# Patient Record
Sex: Male | Born: 1960 | Race: Black or African American | Hispanic: No | Marital: Married | Smoking: Never smoker
Health system: Southern US, Community
[De-identification: ages and names within clinical notes are randomized; demographics above are authoritative.]

## PROBLEM LIST (undated history)

## (undated) DIAGNOSIS — E7800 Pure hypercholesterolemia, unspecified: Secondary | ICD-10-CM

## (undated) HISTORY — PX: HEMORRHOID SURGERY: SHX153

## (undated) HISTORY — DX: Pure hypercholesterolemia, unspecified: E78.00

---

## 2005-06-07 HISTORY — PX: PLANTAR FASCIA SURGERY: SHX746

## 2007-02-28 ENCOUNTER — Ambulatory Visit: Payer: Self-pay | Admitting: Gastroenterology

## 2011-03-30 ENCOUNTER — Ambulatory Visit: Payer: Self-pay | Admitting: Family Medicine

## 2014-08-28 ENCOUNTER — Ambulatory Visit: Payer: Commercial Managed Care - PPO

## 2014-08-28 ENCOUNTER — Ambulatory Visit (INDEPENDENT_AMBULATORY_CARE_PROVIDER_SITE_OTHER): Payer: Commercial Managed Care - PPO

## 2014-08-28 ENCOUNTER — Encounter: Payer: Self-pay | Admitting: Podiatry

## 2014-08-28 ENCOUNTER — Ambulatory Visit (INDEPENDENT_AMBULATORY_CARE_PROVIDER_SITE_OTHER): Payer: Commercial Managed Care - PPO | Admitting: Podiatry

## 2014-08-28 VITALS — BP 145/88 | HR 70 | Resp 16 | Ht 73.0 in | Wt 208.0 lb

## 2014-08-28 DIAGNOSIS — M722 Plantar fascial fibromatosis: Secondary | ICD-10-CM

## 2014-08-28 MED ORDER — MELOXICAM 15 MG PO TABS: 15.0000 mg | ORAL_TABLET | Freq: Every day | ORAL | Status: DC

## 2014-08-28 MED ORDER — METHYLPREDNISOLONE (PAK) 4 MG PO TABS: ORAL_TABLET | ORAL | Status: DC

## 2014-08-28 NOTE — Progress Notes (Signed)
   Subjective:    Patient ID: Daniel Tapia, male    DOB: [birth year], 54 y.o.   MRN: [id number]  HPI Left plantar heel pain. Soreness and numbness . It hurts after resting and the first step down. It has been going on for 3-4 months now.    Review of Systems  All other systems reviewed and are negative.      Objective:   Physical Exam: I have reviewed his past medical history medications allergies surgery social history and review of systems. Pulses are strongly palpable bilateral. Neurologic sensorium is intact percent one C monofilament. Deep tendon reflexes are intact bilateral and muscle strength is 5 over 5 dorsiflexion plantar flexors and inverters and everters all intrinsic musculature is intact. Orthopedic evaluation of a straight spelled palpation medially continue tubercle of the left heel. Radiograph confirms soft tissue increase in density at the plantar fascial calcaneal insertion site.        Assessment & Plan:  Assessment: Plantar fasciitis left.  Plan: Discussed etiology pathology conservative versus surgical therapies. I injected his left heel today placed him in the plantar fascial brace and a night splint. We discussed appropriate shoe gear stretching exercises ice therapy and sugar patients. Started him on a Medrol Dosepak followed by meloxicam. I will follow-up with him in 1 month

## 2014-08-28 NOTE — Patient Instructions (Signed)

## 2014-09-25 ENCOUNTER — Ambulatory Visit (INDEPENDENT_AMBULATORY_CARE_PROVIDER_SITE_OTHER): Payer: 59 | Admitting: Podiatry

## 2014-09-25 VITALS — BP 114/71 | HR 61 | Resp 16

## 2014-09-25 DIAGNOSIS — M722 Plantar fascial fibromatosis: Secondary | ICD-10-CM

## 2014-09-25 NOTE — Progress Notes (Signed)
He presents today for follow-up of his plantar fasciitis. He states that he is approximately 80-85% improved. He continues his anti-inflammatories and all other conservative therapies.  Objective: Vital signs are stable he is alert and oriented 3. Pulses are palpable bilateral. Neurologic sensorium is intact percent joint C monofilament. He has pain on palpation medial calcaneal tubercle bilateral heels that much decreased from last visit.  Assessment: Plantar fasciitis bilateral.  Plan: Reinjected bilateral heels today continue all conservative therapies. Follow up with me in 1 month if necessary at which time orthotics will be scanned.

## 2014-10-23 ENCOUNTER — Ambulatory Visit: Payer: Commercial Managed Care - PPO | Admitting: Podiatry

## 2015-03-14 ENCOUNTER — Other Ambulatory Visit: Payer: Self-pay | Admitting: Podiatry

## 2015-06-13 ENCOUNTER — Encounter: Payer: Self-pay | Admitting: Family Medicine

## 2015-06-13 ENCOUNTER — Ambulatory Visit (INDEPENDENT_AMBULATORY_CARE_PROVIDER_SITE_OTHER): Payer: 59 | Admitting: Family Medicine

## 2015-06-13 VITALS — BP 118/64 | HR 92 | Temp 97.8°F | Resp 18 | Ht 73.0 in | Wt 209.0 lb

## 2015-06-13 DIAGNOSIS — M79672 Pain in left foot: Secondary | ICD-10-CM | POA: Diagnosis not present

## 2015-06-13 DIAGNOSIS — Z0000 Encounter for general adult medical examination without abnormal findings: Secondary | ICD-10-CM

## 2015-06-13 DIAGNOSIS — M79645 Pain in left finger(s): Secondary | ICD-10-CM

## 2015-06-13 DIAGNOSIS — Z23 Encounter for immunization: Secondary | ICD-10-CM | POA: Diagnosis not present

## 2015-06-13 DIAGNOSIS — Z125 Encounter for screening for malignant neoplasm of prostate: Secondary | ICD-10-CM | POA: Diagnosis not present

## 2015-06-13 MED ORDER — AZITHROMYCIN 250 MG PO TABS: ORAL_TABLET | ORAL | Status: DC

## 2015-06-13 MED ORDER — BENZONATATE 100 MG PO CAPS: 100.0000 mg | ORAL_CAPSULE | Freq: Two times a day (BID) | ORAL | Status: DC | PRN

## 2015-06-13 NOTE — Progress Notes (Signed)
Name: Daniel Tapia   MRN: [id number]    DOB: [birth year]   Date:06/13/2015       Progress Note  Subjective  Chief Complaint  Chief Complaint  Patient presents with  . Annual Exam    HPI    56 year old male presenting for annual H&P.   Plantar fasciitis  Patient has a history of bilateral heel pain greater on the left. He has seen a podiatrist and had at least 2 injections in the left heel and is using an insert with no significant improvement. He's currently on meloxicam which is not bring any improvement either.  Left index finger injury  Patient states that at his employment he often has to spend an object with his fingers rapidly. He did a few weeks ago and had sudden onset of some pain and is comfort and since that time as been able to straighten the finger completely and is swollen and tender since. He did it was not evaluated and did not have it x-rayed. The pain is persisting and meloxicam is not bringing any improvement.   No past medical history on file.  Social History  Substance Use Topics  . Smoking status: Never Smoker   . Smokeless tobacco: Not on file  . Alcohol Use: Not on file     Current outpatient prescriptions:  .  meloxicam (MOBIC) 15 MG tablet, TAKE 1 TABLET (15 MG TOTAL) BY MOUTH DAILY., Disp: 30 tablet, Rfl: 7 .  methylPREDNIsolone (MEDROL DOSPACK) 4 MG tablet, 6 day tapering dose-follow package instructions (Patient not taking: Reported on 06/13/2015), Disp: 21 tablet, Rfl: 0  Not on File  Review of Systems  Constitutional: Negative for fever, chills and weight loss.  HENT: Negative for congestion, hearing loss, sore throat and tinnitus.   Eyes: Negative for blurred vision, double vision and redness.  Respiratory: Negative for cough, hemoptysis and shortness of breath.   Cardiovascular: Negative for chest pain, palpitations, orthopnea, claudication and leg swelling.  Gastrointestinal: Negative for heartburn, nausea, vomiting, diarrhea,  constipation and blood in stool.  Genitourinary: Negative for dysuria, urgency, frequency and hematuria.  Musculoskeletal: Positive for joint pain (left index finger. There is also bilateral heel pain). Negative for myalgias, back pain, falls and neck pain.  Skin: Negative for itching.  Neurological: Negative for dizziness, tingling, tremors, focal weakness, seizures, loss of consciousness, weakness and headaches.  Endo/Heme/Allergies: Does not bruise/bleed easily.  Psychiatric/Behavioral: Negative for depression and substance abuse. The patient is not nervous/anxious and does not have insomnia.      Objective  Filed Vitals:   06/13/15 0906  BP: 118/64  Pulse: 92  Temp: 97.8 F (36.6 C)  Resp: 18  Height: 6\' 1"  (1.854 m)  Weight: 209 lb (94.802 kg)  SpO2: 97%     Physical Exam  Constitutional: He is oriented to person, place, and time and well-developed, well-nourished, and in no distress.  HENT:  Head: Normocephalic.  Eyes: EOM are normal. Pupils are equal, round, and reactive to light.  Neck: Normal range of motion. Neck supple. No thyromegaly present.  Cardiovascular: Normal rate, regular rhythm and normal heart sounds.   No murmur heard. Pulmonary/Chest: Effort normal and breath sounds normal. No respiratory distress. He has no wheezes.  Abdominal: Soft. Bowel sounds are normal.  Genitourinary: Rectum normal, prostate normal and penis normal. Guaiac negative stool. No discharge found.  Musculoskeletal: He exhibits no edema.  The left index finger has used hormone swelling at the PIP joint. There is also some tenderness to  palpation on the palmar aspect of the digit. There is 4 range of motion but some discomfort with full flexion. There is no point tenderness. There is also discomfort palpation at the base of the left calcaneus reproducing pain.  Lymphadenopathy:    He has no cervical adenopathy.  Neurological: He is alert and oriented to person, place, and time. No  cranial nerve deficit. Gait normal. Coordination normal.  Skin: Skin is warm and dry. No rash noted.  Psychiatric: Affect and judgment normal.      Assessment & Plan  1. Annual physical exam  - CBC - Comprehensive Metabolic Panel (CMET) - Lipid panel - TSH  2. Prostate cancer screening  - PSA  3. Left foot pain Secondary to plantar fasciitis - Ambulatory referral to Orthopedics  4. Finger pain, left Rule out avulsion fracture - Ambulatory referral to Orthopedics  5. Need for influenza vaccination Given - Flu Vaccine QUAD 36+ mos PF IM (Fluarix & Fluzone Quad PF)

## 2015-07-01 LAB — COMPREHENSIVE METABOLIC PANEL
A/G RATIO: 1.8 (ref 1.1–2.5)
ALT: 79 IU/L — AB (ref 0–44)
AST: 49 IU/L — AB (ref 0–40)
Albumin: 4.6 g/dL (ref 3.5–5.5)
Alkaline Phosphatase: 95 IU/L (ref 39–117)
BUN/Creatinine Ratio: 10 (ref 9–20)
BUN: 12 mg/dL (ref 6–24)
Bilirubin Total: 0.7 mg/dL (ref 0.0–1.2)
CALCIUM: 9.7 mg/dL (ref 8.7–10.2)
CO2: 26 mmol/L (ref 18–29)
CREATININE: 1.16 mg/dL (ref 0.76–1.27)
Chloride: 100 mmol/L (ref 96–106)
GFR calc non Af Amer: 71 mL/min/{1.73_m2} (ref >59)
GFR, EST AFRICAN AMERICAN: 83 mL/min/{1.73_m2} (ref >59)
Globulin, Total: 2.5 g/dL (ref 1.5–4.5)
Glucose: 109 mg/dL — ABNORMAL HIGH (ref 65–99)
Potassium: 4.9 mmol/L (ref 3.5–5.2)
Sodium: 138 mmol/L (ref 134–144)
TOTAL PROTEIN: 7.1 g/dL (ref 6.0–8.5)

## 2015-07-01 LAB — LIPID PANEL
Chol/HDL Ratio: 4.7 ratio units (ref 0.0–5.0)
Cholesterol, Total: 222 mg/dL — ABNORMAL HIGH (ref 100–199)
HDL: 47 mg/dL (ref >39)
LDL CALC: 159 mg/dL — AB (ref 0–99)
TRIGLYCERIDES: 81 mg/dL (ref 0–149)
VLDL Cholesterol Cal: 16 mg/dL (ref 5–40)

## 2015-07-01 LAB — CBC
Hematocrit: 43.4 % (ref 37.5–51.0)
Hemoglobin: 14.5 g/dL (ref 12.6–17.7)
MCH: 27.4 pg (ref 26.6–33.0)
MCHC: 33.4 g/dL (ref 31.5–35.7)
MCV: 82 fL (ref 79–97)
PLATELETS: 272 10*3/uL (ref 150–379)
RBC: 5.3 x10E6/uL (ref 4.14–5.80)
RDW: 13.6 % (ref 12.3–15.4)
WBC: 3.9 10*3/uL (ref 3.4–10.8)

## 2015-07-01 LAB — PSA: Prostate Specific Ag, Serum: 0.4 ng/mL (ref 0.0–4.0)

## 2015-07-01 LAB — TSH: TSH: 1.83 u[IU]/mL (ref 0.450–4.500)

## 2017-06-22 ENCOUNTER — Ambulatory Visit (INDEPENDENT_AMBULATORY_CARE_PROVIDER_SITE_OTHER): Payer: 59 | Admitting: Family Medicine

## 2017-06-22 ENCOUNTER — Telehealth: Payer: Self-pay

## 2017-06-22 ENCOUNTER — Other Ambulatory Visit: Payer: Self-pay

## 2017-06-22 ENCOUNTER — Encounter: Payer: Self-pay | Admitting: Family Medicine

## 2017-06-22 ENCOUNTER — Telehealth: Payer: Self-pay | Admitting: Family Medicine

## 2017-06-22 VITALS — BP 116/76 | HR 84 | Temp 98.4°F | Resp 18 | Ht 73.0 in | Wt 209.2 lb

## 2017-06-22 DIAGNOSIS — Z13220 Encounter for screening for lipoid disorders: Secondary | ICD-10-CM

## 2017-06-22 DIAGNOSIS — Z1212 Encounter for screening for malignant neoplasm of rectum: Secondary | ICD-10-CM | POA: Diagnosis not present

## 2017-06-22 DIAGNOSIS — E7800 Pure hypercholesterolemia, unspecified: Secondary | ICD-10-CM | POA: Diagnosis not present

## 2017-06-22 DIAGNOSIS — Z125 Encounter for screening for malignant neoplasm of prostate: Secondary | ICD-10-CM | POA: Diagnosis not present

## 2017-06-22 DIAGNOSIS — Z0000 Encounter for general adult medical examination without abnormal findings: Secondary | ICD-10-CM | POA: Diagnosis not present

## 2017-06-22 DIAGNOSIS — Z1159 Encounter for screening for other viral diseases: Secondary | ICD-10-CM

## 2017-06-22 DIAGNOSIS — Z8639 Personal history of other endocrine, nutritional and metabolic disease: Secondary | ICD-10-CM | POA: Diagnosis not present

## 2017-06-22 DIAGNOSIS — Z1211 Encounter for screening for malignant neoplasm of colon: Secondary | ICD-10-CM | POA: Diagnosis not present

## 2017-06-22 DIAGNOSIS — E663 Overweight: Secondary | ICD-10-CM

## 2017-06-22 DIAGNOSIS — Z114 Encounter for screening for human immunodeficiency virus [HIV]: Secondary | ICD-10-CM

## 2017-06-22 NOTE — Telephone Encounter (Signed)
Patient notified to stop by at any time to have Tdap

## 2017-06-22 NOTE — Telephone Encounter (Signed)
Gastroenterology Pre-Procedure Review  Request Date: 07/06/17 Requesting Physician: Dr. Marius Ditch  PATIENT REVIEW QUESTIONS: The patient responded to the following health history questions as indicated:    1. Are you having any GI issues? no 2. Do you have a personal history of Polyps? no 3. Do you have a family history of Colon Cancer or Polyps? yes (Dad Polyps) 4. Diabetes Mellitus? no 5. Joint replacements in the past 12 months?no 6. Major health problems in the past 3 months?no 7. Any artificial heart valves, MVP, or defibrillator?no    MEDICATIONS & ALLERGIES:    Patient reports the following regarding taking any anticoagulation/antiplatelet therapy:   Plavix, Coumadin, Eliquis, Xarelto, Lovenox, Pradaxa, Brilinta, or Effient? no Aspirin? no  Patient confirms/reports the following medications:  No current outpatient medications on file.   No current facility-administered medications for this visit.     Patient confirms/reports the following allergies:  No Known Allergies  No orders of the defined types were placed in this encounter.   AUTHORIZATION INFORMATION Primary Insurance: 1D#: Group #:  Secondary Insurance: 1D#: Group #:  SCHEDULE INFORMATION: Date: 07/06/17 Time: Location:[facility]

## 2017-06-22 NOTE — Patient Instructions (Addendum)

## 2017-06-22 NOTE — Telephone Encounter (Signed)
Pt did not receive TdaP in office today, please call and have him return for nurse visit at his convenience. Thanks!

## 2017-06-22 NOTE — Progress Notes (Signed)
Name: Daniel Tapia   MRN: [id number]    DOB: 06/25/1960   Date:06/22/2017       Progress Note  Subjective  Chief Complaint  Chief Complaint  Patient presents with  . Annual Exam    HPI  Patient presents for annual CPE.  USPSTF grade A and B recommendations:  Diet: Is on the road a lot, eats out a lot because of this - avoids fast food, but still a lot restaurant foods for breakfast and lunch. Dinner he usually has a balanced meal that's a little healthier - occasionally eats at home. 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  IPSS Questionnaire (AUA-7): Over the past month.   1)  How often have you had a sensation of not emptying your bladder completely after you finish urinating?  1 - Less than 1 time in 5  2)  How often have you had to urinate again less than two hours after you finished urinating? 4 - More than half the time  3)  How often have you found you stopped and started again several times when you urinated?  0 - Not at all  4) How difficult have you found it to postpone urination?  0 - Not at all  5) How often have you had a weak urinary stream?  0 - Not at all  6) How often have you had to push or strain to begin urination?  0 - Not at all  7) How many times did you most typically get up to urinate from the time you went to bed until the time you got up in the morning?  1 - 1 time  Total score:  0-7 mildly symptomatic   8-19 moderately symptomatic   20-35 severely symptomatic  AUA score of 6 - we will check PSA again today.  Depression:  Depression screen Daniel Tapia 2/9 06/22/2017 06/13/2015  Decreased Interest 0 0  Down, Depressed, Hopeless 0 0  PHQ - 2 Score 0 0   Hypertension: BP Readings from Last 3 Encounters:  06/22/17 116/76  06/13/15 118/64  09/25/14 114/71   Obesity: Wt Readings from Last 3 Encounters:  06/22/17 209 lb 3.2 oz (94.9 kg)  06/13/15  209 lb (94.8 kg)  08/28/14 208 lb (94.3 kg)   BMI Readings from Last 3 Encounters:  06/22/17 27.60 kg/m  06/13/15 27.57 kg/m  08/28/14 27.44 kg/m    Lipids:  Lab Results  Component Value Date   CHOL 222 (H) 06/30/2015   Lab Results  Component Value Date   HDL 47 06/30/2015   Lab Results  Component Value Date   LDLCALC 159 (H) 06/30/2015   Lab Results  Component Value Date   TRIG 81 06/30/2015   Lab Results  Component Value Date   CHOLHDL 4.7 06/30/2015   No results found for: LDLDIRECT Glucose:  Glucose  Date Value Ref Range Status  06/30/2015 109 (H) 65 - 99 mg/dL Final    Alcohol: Drinks socially - 3-5 in a week; Beer or a couple of mixed drinks Tobacco use: Never user  Married STD testing and prevention (chl/gon/syphilis): Declines. HIV, hep C: We will check one-time screening today  Skin cancer: No concerning moles or lesions. Colorectal cancer: We will refer today; no family history.  No changes to stools - no blood/black and tarry stools Prostate cancer: AUA score of 6,  we will check PSA today. No results found for: PSA Lung cancer: Low Dose CT Chest recommended if Age 19-80 years, 30 pack-year currently smoking OR have quit w/in 15years. Patient does not qualify.   AAA: Does not qualify. The USPSTF recommends one-time screening with ultrasonography in men ages 23 to 31 years who have ever smoked  Aspirin: We will check cholesterol panel today and determine if needed. ECG: Denies chest pain, shortness of breath, or palpitations. We will hold on this today.  Advanced Care Planning: A voluntary discussion about advance care planning including the explanation and discussion of advance directives.  Discussed health care proxy and Living will, and the patient was able to identify a health care proxy as Wife Daniel Tapia).  Patient does not have a living will at present time. If patient does have living will, I have requested they bring this to the  clinic to be scanned in to their chart.  There are no active problems to display for this patient.   Past Surgical History:  Procedure Laterality Date  . HEMORRHOID SURGERY     1990's    Family History  Problem Relation Age of Onset  . Dementia Mother   . Diabetes Paternal Grandfather     Social History   Socioeconomic History  . Marital status: Married    Spouse name: Not on file  . Number of children: Not on file  . Years of education: Not on file  . Highest education level: Not on file  Social Needs  . Financial resource strain: Not on file  . Food insecurity - worry: Not on file  . Food insecurity - inability: Not on file  . Transportation needs - medical: Not on file  . Transportation needs - non-medical: Not on file  Occupational History  . Not on file  Tobacco Use  . Smoking status: Never Smoker  . Smokeless tobacco: Never Used  Substance and Sexual Activity  . Alcohol use: Yes    Alcohol/week: 0.0 oz  . Drug use: No  . Sexual activity: Yes  Other Topics Concern  . 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 Sometimes back home at night, sometimes he's gone for a week at a time. Married with 4 children; 1 grandchild.    No current outpatient medications on file.  No Known Allergies   ROS  Constitutional: Negative for fever or weight change.  Respiratory: Negative for cough and shortness of breath.   Cardiovascular: Negative for chest pain or palpitations.  Gastrointestinal: Negative for abdominal pain, no bowel changes.  Musculoskeletal: Negative for gait problem or joint swelling.  Skin: Negative for rash.  Neurological: Negative for dizziness or headache.  No other specific complaints in a complete review of systems (except as listed in HPI above).   Objective  Vitals:   06/22/17 0958  BP: 116/76  Pulse: 84  Resp: 18  Temp: 98.4 F (36.9 C)  TempSrc: Oral  SpO2: 99%   Weight: 209 lb 3.2 oz (94.9 kg)  Height: 6\' 1"  (1.854 m)    Body mass index is 27.6 kg/m.  Physical Exam Constitutional: Patient appears well-developed and well-nourished. No distress.  HENT: Head: Normocephalic and atraumatic. Ears: B TMs ok, no erythema or effusion; Nose: Nose normal. Mouth/Throat: Oropharynx is clear and moist. No oropharyngeal exudate.  Eyes: Conjunctivae and EOM are normal. Pupils are equal, round, and reactive to light. No scleral icterus.  Neck: Normal range of motion. Neck supple. No JVD present. No thyromegaly present.  Cardiovascular: Normal rate, regular rhythm and normal heart sounds.  No murmur heard. No BLE edema. Pulmonary/Chest: Effort normal and breath sounds normal. No respiratory distress. Abdominal: Soft. Bowel sounds are normal, no distension. There is no tenderness. no masses MALE GENITALIA:Deferred RECTAL:Deferred Musculoskeletal: Normal range of motion, no joint effusions. No gross deformities Neurological: he is alert and oriented to person, place, and time. No cranial nerve deficit. Coordination, balance, strength, speech and gait are normal.  Skin: Skin is warm and dry. No rash noted. No erythema.  Psychiatric: Patient has a normal mood and affect. behavior is normal. Judgment and thought content normal.  No results found for this or any previous visit (from the past 2160 hour(s)).  PHQ2/9: Depression screen [facility] 2/9 06/22/2017 06/13/2015  Decreased Interest 0 0  Down, Depressed, Hopeless 0 0  PHQ - 2 Score 0 0   Fall Risk: Fall Risk  06/22/2017 06/13/2015  Falls in the past year? No No   Assessment & Plan 1. Encounter for preventive health examination -Prostate cancer screening and PSA options (with potential risks and benefits of testing vs not testing) were discussed along with recent recs/guidelines. -USPSTF grade A and B recommendations reviewed with patient; age-appropriate recommendations, preventive care, screening tests, etc  discussed and encouraged; healthy living encouraged; see AVS for patient education given to patient -Discussed importance of 150 minutes of physical activity weekly, eat two servings of fish weekly, eat one serving of tree nuts ( cashews, pistachios, pecans, almonds.Marland Kitchen) every other day, eat 6 servings of fruit/vegetables daily and drink plenty of water and avoid sweet beverages.  - Health Maintenance: Declines flu shot  2. Overweight (BMI 25.0-29.9) - COMPLETE METABOLIC PANEL WITH GFR  3. Elevated LDL cholesterol level - Lipid panel  4. Screening cholesterol level - Lipid panel  5. Prostate cancer screening - PSA  6. History of elevated glucose - COMPLETE METABOLIC PANEL WITH GFR  7. Screening for colorectal cancer - Ambulatory referral to Gastroenterology  8. Need for hepatitis C screening test - Hepatitis C antibody  9. Encounter for screening for HIV - HIV antibody

## 2017-06-23 ENCOUNTER — Telehealth: Payer: Self-pay | Admitting: Family Medicine

## 2017-06-23 ENCOUNTER — Encounter: Payer: 59 | Admitting: Family Medicine

## 2017-06-23 ENCOUNTER — Other Ambulatory Visit: Payer: Self-pay | Admitting: Family Medicine

## 2017-06-23 DIAGNOSIS — R7301 Impaired fasting glucose: Secondary | ICD-10-CM

## 2017-06-23 LAB — HEPATITIS C ANTIBODY
HEP C AB: NONREACTIVE
SIGNAL TO CUT-OFF: 0.12 (ref <1.00)

## 2017-06-23 LAB — LIPID PANEL
CHOL/HDL RATIO: 4.4 (calc) (ref <5.0)
CHOLESTEROL: 230 mg/dL — AB (ref <200)
HDL: 52 mg/dL (ref >40)
LDL Cholesterol (Calc): 158 mg/dL (calc) — ABNORMAL HIGH
NON-HDL CHOLESTEROL (CALC): 178 mg/dL — AB (ref <130)
Triglycerides: 95 mg/dL (ref <150)

## 2017-06-23 LAB — COMPLETE METABOLIC PANEL WITH GFR
AG RATIO: 1.7 (calc) (ref 1.0–2.5)
ALBUMIN MSPROF: 4.5 g/dL (ref 3.6–5.1)
ALKALINE PHOSPHATASE (APISO): 67 U/L (ref 40–115)
ALT: 33 U/L (ref 9–46)
AST: 22 U/L (ref 10–35)
BUN: 17 mg/dL (ref 7–25)
CO2: 30 mmol/L (ref 20–32)
Calcium: 10 mg/dL (ref 8.6–10.3)
Chloride: 102 mmol/L (ref 98–110)
Creat: 1.12 mg/dL (ref 0.70–1.33)
GFR, EST NON AFRICAN AMERICAN: 74 mL/min/{1.73_m2} (ref >=60)
GFR, Est African American: 85 mL/min/{1.73_m2} (ref >=60)
GLOBULIN: 2.7 g/dL (ref 1.9–3.7)
Glucose, Bld: 108 mg/dL — ABNORMAL HIGH (ref 65–99)
POTASSIUM: 5.3 mmol/L (ref 3.5–5.3)
Sodium: 139 mmol/L (ref 135–146)
Total Bilirubin: 0.9 mg/dL (ref 0.2–1.2)
Total Protein: 7.2 g/dL (ref 6.1–8.1)

## 2017-06-23 LAB — HIV ANTIBODY (ROUTINE TESTING W REFLEX): HIV: NONREACTIVE

## 2017-06-23 LAB — PSA: PSA: 0.3 ng/mL (ref <=4.0)

## 2017-06-23 NOTE — Progress Notes (Signed)
Spoke to lab and they will add on Hemoglobin A1c

## 2017-06-23 NOTE — Progress Notes (Signed)
Please call lab and add-on A1C - I have placed the order.

## 2017-06-23 NOTE — Telephone Encounter (Signed)
Results not routed to [facility]  Results given  Notes recorded by Hubbard Hartshorn, FNP on 06/23/2017 at 8:07 AM EST PSA normal CMP- elevated fasting glucose - **I have added on A1C, please ensure this is communicated to the lab**; otherwise kidney and transaminases are within normal limits. Cholesterol Panel - elevated LDL, HDL is at goal. ASCVD 10 year risk for heart attack/stroke is 5.8% - no need to start Statin or Aspirin therapy at this time. Hep C and HIV negative - please update HM.

## 2017-07-04 ENCOUNTER — Other Ambulatory Visit: Payer: Self-pay

## 2017-07-04 MED ORDER — NA SULFATE-K SULFATE-MG SULF 17.5-3.13-1.6 GM/177ML PO SOLN: 1.0000 | Freq: Once | ORAL | 0 refills | Status: AC

## 2017-07-04 NOTE — Progress Notes (Signed)
Rx for Su-Prep Faxed to [facility].

## 2017-07-05 ENCOUNTER — Encounter: Payer: Self-pay | Admitting: *Deleted

## 2017-07-05 ENCOUNTER — Other Ambulatory Visit: Payer: Self-pay

## 2017-07-05 NOTE — Discharge Instructions (Signed)
General Anesthesia, Adult, Care After °These instructions provide you with information about caring for yourself after your procedure. Your health care provider may also give you more specific instructions. Your treatment has been planned according to current medical practices, but problems sometimes occur. Call your health care provider if you have any problems or questions after your procedure. °What can I expect after the procedure? °After the procedure, it is common to have: °· Vomiting. °· A sore throat. °· Mental slowness. ° °It is common to feel: °· Nauseous. °· Cold or shivery. °· Sleepy. °· Tired. °· Sore or achy, even in parts of your body where you did not have surgery. ° °Follow these instructions at home: °For at least 24 hours after the procedure: °· Do not: °? Participate in activities where you could fall or become injured. °? Drive. °? Use heavy machinery. °? Drink alcohol. °? Take sleeping pills or medicines that cause drowsiness. °? Make important decisions or sign legal documents. °? Take care of children on your own. °· Rest. °Eating and drinking °· If you vomit, drink water, juice, or soup when you can drink without vomiting. °· Drink enough fluid to keep your urine clear or pale yellow. °· Make sure you have little or no nausea before eating solid foods. °· Follow the diet recommended by your health care provider. °General instructions °· Have a responsible adult stay with you until you are awake and alert. °· Return to your normal activities as told by your health care provider. Ask your health care provider what activities are safe for you. °· Take over-the-counter and prescription medicines only as told by your health care provider. °· If you smoke, do not smoke without supervision. °· Keep all follow-up visits as told by your health care provider. This is important. °Contact a health care provider if: °· You continue to have nausea or vomiting at home, and medicines are not helpful. °· You  cannot drink fluids or start eating again. °· You cannot urinate after 8-12 hours. °· You develop a skin rash. °· You have fever. °· You have increasing redness at the site of your procedure. °Get help right away if: °· You have difficulty breathing. °· You have chest pain. °· You have unexpected bleeding. °· You feel that you are having a life-threatening or urgent problem. °This information is not intended to replace advice given to you by your health care provider. Make sure you discuss any questions you have with your health care provider. °Document Released: 08/30/2000 Document Revised: 10/27/2015 Document Reviewed: 05/08/2015 °Elsevier Interactive Patient Education © 2018 [employer] ° °

## 2017-07-06 ENCOUNTER — Encounter
Admission: RE | Disposition: A | Discharge status: Home / Self Care | Payer: Self-pay | Source: Ambulatory Visit | Attending: Gastroenterology

## 2017-07-06 ENCOUNTER — Ambulatory Visit: Payer: 59 | Admitting: Anesthesiology

## 2017-07-06 ENCOUNTER — Ambulatory Visit
Admission: RE | Admit: 2017-07-06 | Discharge: 2017-07-06 | Disposition: A | Discharge status: Home / Self Care | Payer: 59 | Source: Ambulatory Visit | Attending: Gastroenterology | Admitting: Gastroenterology

## 2017-07-06 DIAGNOSIS — Z1211 Encounter for screening for malignant neoplasm of colon: Secondary | ICD-10-CM | POA: Diagnosis not present

## 2017-07-06 DIAGNOSIS — D123 Benign neoplasm of transverse colon: Secondary | ICD-10-CM

## 2017-07-06 DIAGNOSIS — K635 Polyp of colon: Secondary | ICD-10-CM | POA: Diagnosis not present

## 2017-07-06 HISTORY — PX: POLYPECTOMY: SHX5525

## 2017-07-06 HISTORY — PX: COLONOSCOPY WITH PROPOFOL: SHX5780

## 2017-07-06 SURGERY — COLONOSCOPY WITH PROPOFOL
Anesthesia: General | Site: Rectum | Wound class: Contaminated

## 2017-07-06 MED ORDER — OXYCODONE HCL 5 MG PO TABS: 5.0000 mg | ORAL_TABLET | Freq: Once | ORAL | Status: DC | PRN

## 2017-07-06 MED ORDER — MEPERIDINE HCL 25 MG/ML IJ SOLN: 6.2500 mg | INTRAMUSCULAR | Status: DC | PRN

## 2017-07-06 MED ORDER — FENTANYL CITRATE (PF) 100 MCG/2ML IJ SOLN
25.0000 ug | INTRAMUSCULAR | Status: DC | PRN
Start: 2017-07-06 — End: 2017-07-06

## 2017-07-06 MED ORDER — DEXMEDETOMIDINE HCL 200 MCG/2ML IV SOLN
INTRAVENOUS | Status: DC | PRN
  Administered 2017-07-06: 8 ug via INTRAVENOUS

## 2017-07-06 MED ORDER — OXYCODONE HCL 5 MG/5ML PO SOLN: 5.0000 mg | Freq: Once | ORAL | Status: DC | PRN

## 2017-07-06 MED ORDER — LIDOCAINE HCL (CARDIAC) 20 MG/ML IV SOLN
INTRAVENOUS | Status: DC | PRN
  Administered 2017-07-06: 30 mg via INTRAVENOUS

## 2017-07-06 MED ORDER — STERILE WATER FOR IRRIGATION IR SOLN
Status: DC | PRN
  Administered 2017-07-06: 13:00:00

## 2017-07-06 MED ORDER — SODIUM CHLORIDE 0.9 % IV SOLN: INTRAVENOUS | Status: DC

## 2017-07-06 MED ORDER — LACTATED RINGERS IV SOLN
10.0000 mL/h | INTRAVENOUS | Status: DC
  Administered 2017-07-06: 10 mL/h via INTRAVENOUS
  Administered 2017-07-06: 13:00:00 via INTRAVENOUS

## 2017-07-06 MED ORDER — PROMETHAZINE HCL 25 MG/ML IJ SOLN: 6.2500 mg | INTRAMUSCULAR | Status: DC | PRN

## 2017-07-06 MED ORDER — PROPOFOL 500 MG/50ML IV EMUL
INTRAVENOUS | Status: DC | PRN
  Administered 2017-07-06: 120 ug/kg/min via INTRAVENOUS

## 2017-07-06 SURGICAL SUPPLY — 7 items
CANISTER SUCT 1200ML W/VALVE (MISCELLANEOUS) ×3 IMPLANT
GOWN CVR UNV OPN BCK APRN NK (MISCELLANEOUS) ×4 IMPLANT
GOWN ISOL THUMB LOOP REG UNIV (MISCELLANEOUS) ×2
KIT ENDO PROCEDURE OLY (KITS) ×3 IMPLANT
SNARE COLD EXACTO (MISCELLANEOUS) ×3 IMPLANT
TRAP ETRAP POLY (MISCELLANEOUS) ×3 IMPLANT
WATER STERILE IRR 250ML POUR (IV SOLUTION) ×3 IMPLANT

## 2017-07-06 NOTE — Anesthesia Procedure Notes (Signed)
Procedure Name: MAC Performed by: Lind Guest, CRNA Pre-anesthesia Checklist: Patient identified, Emergency Drugs available, Suction available, Patient being monitored and Timeout performed Patient Re-evaluated:Patient Re-evaluated prior to induction Oxygen Delivery Method: Nasal cannula

## 2017-07-06 NOTE — Transfer of Care (Signed)
Immediate Anesthesia Transfer of Care Note  Patient: Daniel Tapia  Procedure(s) Performed: COLONOSCOPY WITH PROPOFOL (N/A Rectum) POLYPECTOMY (Rectum)  Patient Location: PACU  Anesthesia Type: General  Level of Consciousness: awake, alert  and patient cooperative  Airway and Oxygen Therapy: Patient Spontanous Breathing and Patient connected to supplemental oxygen  Post-op Assessment: Post-op Vital signs reviewed, Patient's Cardiovascular Status Stable, Respiratory Function Stable, Patent Airway and No signs of Nausea or vomiting  Post-op Vital Signs: Reviewed and stable  Complications: No apparent anesthesia complications

## 2017-07-06 NOTE — H&P (Signed)
  Cephas Darby, MD [address]  [place]  [place], [place] [address]  Main: [phone]  Fax: [phone] Pager: [phone]  Primary Care Physician:  Arnetha Courser, MD Primary Gastroenterologist:  Dr. Cephas Darby  Pre-Procedure History & Physical: HPI:  Daniel Tapia is a 57 y.o. male is here for an colonoscopy.   Past Medical History:  Diagnosis Date  . Elevated LDL cholesterol level     Past Surgical History:  Procedure Laterality Date  . HEMORRHOID SURGERY     1990's  . PLANTAR FASCIA SURGERY  2007    Prior to Admission medications   Not on File    Allergies as of 06/22/2017  . (No Known Allergies)    Family History  Problem Relation Age of Onset  . Dementia Mother   . Diabetes Paternal Grandfather     Social History   Socioeconomic History  . Marital status: Married    Spouse name: Not on file  . Number of children: Not on file  . Years of education: Not on file  . Highest education level: Not on file  Social Needs  . Financial resource strain: Not on file  . Food insecurity - worry: Not on file  . Food insecurity - inability: Not on file  . Transportation needs - medical: Not on file  . Transportation needs - non-medical: Not on file  Occupational History  . Not on file  Tobacco Use  . Smoking status: Never Smoker  . Smokeless tobacco: Never Used  Substance and Sexual Activity  . Alcohol use: Yes    Alcohol/week: 1.2 oz    Types: 1 Cans of beer, 1 Shots of liquor per week  . Drug use: No  . Sexual activity: Yes  Other Topics Concern  . 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 Sometimes back home at night, sometimes he's gone for a week at a time. Married with 4 children; 1 grandchild.    Review of Systems: See HPI, otherwise negative ROS  Physical Exam: Ht 6\' 1"  (1.854 m)   Wt 209 lb (94.8 kg)   BMI 27.57 kg/m  General:   Alert,  pleasant and  cooperative in NAD Head:  Normocephalic and atraumatic. Neck:  Supple; no masses or thyromegaly. Lungs:  Clear throughout to auscultation.    Heart:  Regular rate and rhythm. Abdomen:  Soft, nontender and nondistended. Normal bowel sounds, without guarding, and without rebound.   Neurologic:  Alert and  oriented x4;  grossly normal neurologically.  Impression/Plan: Daniel Tapia is here for an colonoscopy to be performed for screening colon cancer  Risks, benefits, limitations, and alternatives regarding  colonoscopy have been reviewed with the patient.  Questions have been answered.  All parties agreeable.   Sherri Sear, MD  07/06/2017, 11:17 AM

## 2017-07-06 NOTE — Anesthesia Preprocedure Evaluation (Signed)
Anesthesia Evaluation  Patient identified by MRN, date of birth, ID band Patient awake    Reviewed: Allergy & Precautions, H&P , NPO status , Patient's Chart, lab work & pertinent test results, reviewed documented beta blocker date and time   Airway Mallampati: II  TM Distance: >3 FB Neck ROM: full    Dental no notable dental hx.    Pulmonary neg pulmonary ROS,    Pulmonary exam normal breath sounds clear to auscultation       Cardiovascular Exercise Tolerance: Good negative cardio ROS   Rhythm:regular Rate:Normal     Neuro/Psych negative neurological ROS  negative psych ROS   GI/Hepatic negative GI ROS, Neg liver ROS,   Endo/Other  negative endocrine ROS  Renal/GU negative Renal ROS  negative genitourinary   Musculoskeletal   Abdominal   Peds  Hematology negative hematology ROS (+)   Anesthesia Other Findings   Reproductive/Obstetrics negative OB ROS                             Anesthesia Physical Anesthesia Plan  ASA: I  Anesthesia Plan: General   Post-op Pain Management:    Induction:   PONV Risk Score and Plan:   Airway Management Planned:   Additional Equipment:   Intra-op Plan:   Post-operative Plan:   Informed Consent: I have reviewed the patients History and Physical, chart, labs and discussed the procedure including the risks, benefits and alternatives for the proposed anesthesia with the patient or authorized representative who has indicated his/her understanding and acceptance.   Dental Advisory Given  Plan Discussed with: CRNA  Anesthesia Plan Comments:         Anesthesia Quick Evaluation  

## 2017-07-06 NOTE — Op Note (Signed)
[facility] Patient Name: Daniel Tapia Procedure Date: 07/06/2017 12:33 PM MRN: [id number] Account #: [id number] Date of Birth: 1960/10/17 Admit Type: Outpatient Age: 57 Room: [facility] OR ROOM 01 Gender: Male Note Status: Finalized Procedure:            Colonoscopy Indications:          Screening for colorectal malignant neoplasm Providers:            Lin Landsman MD, MD Referring MD:         Arnetha Courser (Referring MD) Medicines:            Monitored Anesthesia Care Complications:        No immediate complications. Estimated blood loss:                        Minimal. Procedure:            Pre-Anesthesia Assessment:                       - Prior to the procedure, a History and Physical was                        performed, and patient medications and allergies were                        reviewed. The patient is competent. The risks and                        benefits of the procedure and the sedation options and                        risks were discussed with the patient. All questions                        were answered and informed consent was obtained.                        Patient identification and proposed procedure were                        verified by the physician, the nurse, the                        anesthesiologist, the anesthetist and the technician in                        the pre-procedure area in the procedure room. Mental                        Status Examination: alert and oriented. Airway                        Examination: normal oropharyngeal airway and neck                        mobility. Respiratory Examination: clear to                        auscultation. CV Examination: normal. Prophylactic  Antibiotics: The patient does not require prophylactic                        antibiotics. Prior Anticoagulants: The patient has                        taken no previous anticoagulant or  antiplatelet agents.                        ASA Grade Assessment: I - A normal, healthy patient.                        After reviewing the risks and benefits, the patient was                        deemed in satisfactory condition to undergo the                        procedure. The anesthesia plan was to use monitored                        anesthesia care (MAC). Immediately prior to                        administration of medications, the patient was                        re-assessed for adequacy to receive sedatives. The                        heart rate, respiratory rate, oxygen saturations, blood                        pressure, adequacy of pulmonary ventilation, and                        response to care were monitored throughout the                        procedure. The physical status of the patient was                        re-assessed after the procedure.                       After obtaining informed consent, the colonoscope was                        passed under direct vision. Throughout the procedure,                        the patient's blood pressure, pulse, and oxygen                        saturations were monitored continuously. The Olympus                        CF-HQ190L Colonoscope (S#. [id number]) was introduced                        through the anus  and advanced to the the cecum,                        identified by appendiceal orifice and ileocecal valve.                        The colonoscopy was extremely difficult due to poor                        bowel prep with stool present, significant looping, a                        tortuous colon and the patient's body habitus.                        Successful completion of the procedure was aided by                        changing the patient to a supine position, using manual                        pressure and lavage. The patient tolerated the                        procedure well. The quality of the bowel  preparation                        was adequate to identify polyps 6 mm and larger in size. Findings:      The perianal and digital rectal examinations were normal. Pertinent       negatives include normal sphincter tone and no palpable rectal lesions.      A 5 mm polyp was found in the transverse colon. The polyp was sessile.       The polyp was removed with a cold snare. Resection and retrieval were       complete.      Copious quantities of liquid stool was found in the entire colon, making       visualization difficult. Lavage of the area was performed using greater       than 500 mL of normal saline, resulting in clearance with fair       visualization. Estimated blood loss was minimal.      The retroflexed view of the distal rectum and anal verge was normal and       showed no anal or rectal abnormalities. Impression:           - One 5 mm polyp in the transverse colon, removed with                        a cold snare. Resected and retrieved.                       - Stool in the entire examined colon.                       - The distal rectum and anal verge are normal on                        retroflexion view. Recommendation:       - Discharge  patient to home.                       - Resume previous diet today.                       - Continue present medications.                       - Await pathology results.                       - Repeat colonoscopy in 5 years for surveillance. Procedure Code(s):    --- Professional ---                       [phone], Colonoscopy, flexible; with removal of tumor(s),                        polyp(s), or other lesion(s) by snare technique Diagnosis Code(s):    --- Professional ---                       Z12.11, Encounter for screening for malignant neoplasm                        of colon                       D12.3, Benign neoplasm of transverse colon (hepatic                        flexure or splenic flexure) CPT copyright 2016 American Medical  Association. All rights reserved. The codes documented in this report are preliminary and upon coder review may  be revised to meet current compliance requirements. Dr. Ulyess Mort Lin Landsman MD, MD 07/06/2017 1:33:57 PM This report has been signed electronically. Number of Addenda: 0 Note Initiated On: 07/06/2017 12:33 PM Scope Withdrawal Time: 0 hours 13 minutes 2 seconds  Total Procedure Duration: 0 hours 44 minutes 26 seconds       [facility]

## 2017-07-06 NOTE — Anesthesia Postprocedure Evaluation (Signed)
Anesthesia Post Note  Patient: Daniel Tapia  Procedure(s) Performed: COLONOSCOPY WITH PROPOFOL (N/A Rectum) POLYPECTOMY (Rectum)  Patient location during evaluation: PACU Anesthesia Type: General Level of consciousness: awake and alert Pain management: pain level controlled Vital Signs Assessment: post-procedure vital signs reviewed and stable Respiratory status: spontaneous breathing, nonlabored ventilation, respiratory function stable and patient connected to nasal cannula oxygen Cardiovascular status: blood pressure returned to baseline and stable Postop Assessment: no apparent nausea or vomiting Anesthetic complications: no    Armany Mano ELAINE

## 2017-07-12 ENCOUNTER — Encounter: Payer: Self-pay | Admitting: Gastroenterology

## 2017-07-18 ENCOUNTER — Encounter: Payer: Self-pay | Admitting: Gastroenterology

## 2018-06-23 ENCOUNTER — Ambulatory Visit (INDEPENDENT_AMBULATORY_CARE_PROVIDER_SITE_OTHER): Payer: 59 | Admitting: Family Medicine

## 2018-06-23 ENCOUNTER — Encounter: Payer: Self-pay | Admitting: Family Medicine

## 2018-06-23 ENCOUNTER — Ambulatory Visit
Admission: RE | Admit: 2018-06-23 | Discharge: 2018-06-23 | Disposition: A | Discharge status: Home / Self Care | Payer: 59 | Source: Ambulatory Visit | Attending: Family Medicine | Admitting: Family Medicine

## 2018-06-23 ENCOUNTER — Ambulatory Visit
Admission: RE | Admit: 2018-06-23 | Discharge: 2018-06-23 | Disposition: A | Discharge status: Home / Self Care | Payer: 59 | Attending: Family Medicine | Admitting: Family Medicine

## 2018-06-23 VITALS — BP 120/70 | HR 64 | Temp 98.0°F | Ht 73.0 in | Wt 218.8 lb

## 2018-06-23 DIAGNOSIS — G8929 Other chronic pain: Secondary | ICD-10-CM | POA: Diagnosis not present

## 2018-06-23 DIAGNOSIS — Z0000 Encounter for general adult medical examination without abnormal findings: Secondary | ICD-10-CM | POA: Diagnosis not present

## 2018-06-23 DIAGNOSIS — M25562 Pain in left knee: Principal | ICD-10-CM

## 2018-06-23 DIAGNOSIS — Z125 Encounter for screening for malignant neoplasm of prostate: Secondary | ICD-10-CM | POA: Diagnosis not present

## 2018-06-23 DIAGNOSIS — Z23 Encounter for immunization: Secondary | ICD-10-CM

## 2018-06-23 DIAGNOSIS — M1712 Unilateral primary osteoarthritis, left knee: Secondary | ICD-10-CM | POA: Diagnosis not present

## 2018-06-23 LAB — COMPLETE METABOLIC PANEL WITH GFR
AG RATIO: 1.8 (calc) (ref 1.0–2.5)
ALT: 36 U/L (ref 9–46)
AST: 23 U/L (ref 10–35)
Albumin: 4.4 g/dL (ref 3.6–5.1)
Alkaline phosphatase (APISO): 65 U/L (ref 40–115)
BILIRUBIN TOTAL: 1 mg/dL (ref 0.2–1.2)
BUN: 16 mg/dL (ref 7–25)
CALCIUM: 9.7 mg/dL (ref 8.6–10.3)
CHLORIDE: 101 mmol/L (ref 98–110)
CO2: 32 mmol/L (ref 20–32)
Creat: 1.11 mg/dL (ref 0.70–1.33)
GFR, EST NON AFRICAN AMERICAN: 74 mL/min/{1.73_m2} (ref >=60)
GFR, Est African American: 86 mL/min/{1.73_m2} (ref >=60)
GLOBULIN: 2.4 g/dL (ref 1.9–3.7)
Glucose, Bld: 101 mg/dL — ABNORMAL HIGH (ref 65–99)
POTASSIUM: 4.9 mmol/L (ref 3.5–5.3)
SODIUM: 139 mmol/L (ref 135–146)
Total Protein: 6.8 g/dL (ref 6.1–8.1)

## 2018-06-23 LAB — CBC WITH DIFFERENTIAL/PLATELET
ABSOLUTE MONOCYTES: 392 {cells}/uL (ref 200–950)
Basophils Absolute: 40 cells/uL (ref 0–200)
Basophils Relative: 1 %
EOS ABS: 152 {cells}/uL (ref 15–500)
Eosinophils Relative: 3.8 %
HEMATOCRIT: 44.2 % (ref 38.5–50.0)
Hemoglobin: 14.6 g/dL (ref 13.2–17.1)
LYMPHS ABS: 1532 {cells}/uL (ref 850–3900)
MCH: 27.1 pg (ref 27.0–33.0)
MCHC: 33 g/dL (ref 32.0–36.0)
MCV: 82.2 fL (ref 80.0–100.0)
MPV: 10.2 fL (ref 7.5–12.5)
Monocytes Relative: 9.8 %
NEUTROS PCT: 47.1 %
Neutro Abs: 1884 cells/uL (ref 1500–7800)
PLATELETS: 294 10*3/uL (ref 140–400)
RBC: 5.38 10*6/uL (ref 4.20–5.80)
RDW: 12.8 % (ref 11.0–15.0)
TOTAL LYMPHOCYTE: 38.3 %
WBC: 4 10*3/uL (ref 3.8–10.8)

## 2018-06-23 LAB — TSH: TSH: 1.8 mIU/L (ref 0.40–4.50)

## 2018-06-23 LAB — LIPID PANEL
Cholesterol: 238 mg/dL — ABNORMAL HIGH (ref <200)
HDL: 43 mg/dL (ref >40)
LDL Cholesterol (Calc): 165 mg/dL (calc) — ABNORMAL HIGH
Non-HDL Cholesterol (Calc): 195 mg/dL (calc) — ABNORMAL HIGH (ref <130)
Total CHOL/HDL Ratio: 5.5 (calc) — ABNORMAL HIGH (ref <5.0)
Triglycerides: 153 mg/dL — ABNORMAL HIGH (ref <150)

## 2018-06-23 LAB — PSA: PSA: 0.3 ng/mL (ref <=4.0)

## 2018-06-23 MED ORDER — MELOXICAM 7.5 MG PO TABS: 7.5000 mg | ORAL_TABLET | Freq: Every day | ORAL | 0 refills | Status: DC | PRN

## 2018-06-23 NOTE — Progress Notes (Signed)
BP 120/70   Pulse 64   Temp 98 F (36.7 C) (Oral)   Ht 6\' 1"  (1.854 m)   Wt 218 lb 12.8 oz (99.2 kg)   SpO2 95%   BMI 28.87 kg/m    Subjective:    Patient ID: Daniel Tapia, male    DOB: [birth year], 58 y.o.   MRN: [id number]  HPI: Daniel Tapia is a 58 y.o. male  Chief Complaint  Patient presents with  . Annual Exam  . 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; then straightens and it feels better; no injuries Does a lot of walking, climbs a lot, walks several miles a day; nothing similar in the past He has tried some OTC, does not help even an hours; not taking motrin and aleve together, one or the other; no tylenol; no ice or heat; did try bengay which helps at night No fluid or swelling; no crunching or popping or locking No hx of stomach ulcer  USPSTF grade A and B recommendations Depression:  Depression screen [facility] 2/9 06/23/2018 06/22/2017 06/13/2015  Decreased Interest 0 0 0  Down, Depressed, Hopeless 0 0 0  PHQ - 2 Score 0 0 0  Altered sleeping 0 - -  Tired, decreased energy 0 - -  Change in appetite 0 - -  Feeling bad or failure about yourself  0 - -  Trouble concentrating 0 - -  Moving slowly or fidgety/restless 0 - -  Suicidal thoughts 0 - -  PHQ-9 Score 0 - -  Difficult doing work/chores Not difficult at all - -   Hypertension: BP Readings from Last 3 Encounters:  06/23/18 120/70  07/06/17 112/70  06/22/17 116/76   Obesity: Wt Readings from Last 3 Encounters:  06/23/18 218 lb 12.8 oz (99.2 kg)  07/06/17 206 lb (93.4 kg)  06/22/17 209 lb 3.2 oz (94.9 kg)   BMI Readings from Last 3 Encounters:  06/23/18 28.87 kg/m  07/06/17 27.18 kg/m  06/22/17 27.60 kg/m    Immunizations: just took the shingles vaccine and tetanus; does not want  flu shot Skin cancer: nothing worrisome Lung cancer:  nonsmoker Prostate cancer: check PSA; no sx Lab Results  Component Value Date   PSA 0.3 06/22/2017   Colorectal cancer: UTD AAA: n/a Aspirin: recommended The 10-year ASCVD risk score Mikey Bussing DC Jr., et al., 2013) is: 6.4%   Values used to calculate the score:     Age: 70 years     Sex: Male     Is Non-Hispanic African American: Yes     Diabetic: No     Tobacco smoker: No     Systolic Blood Pressure: 308 mmHg     Is BP treated: No     HDL Cholesterol: 52 mg/dL     Total Cholesterol: 230 mg/dL  Diet: hard to eat healthy on the road Exercise: tries to walk a lot, 3 miles a day Alcohol:    [facility] Visit [facility] 06/23/2018 in [facility]  AUDIT-C Score  1      Tobacco use: never HIV, hep B, hep C: not interested STD testing and prevention (chl/gon/syphilis): not intesrested Glucose:  Glucose  Date Value Ref Range Status  06/30/2015 109 (H) 65 -  99 mg/dL Final   Glucose, Bld  Date Value Ref Range Status  06/22/2017 108 (H) 65 - 99 mg/dL Final    Comment:    .            Fasting reference interval . For someone without known diabetes, a glucose value between 100 and 125 mg/dL is consistent with prediabetes and should be confirmed with a follow-up test. .    Lipids:  Lab Results  Component Value Date   CHOL 230 (H) 06/22/2017   CHOL 222 (H) 06/30/2015   Lab Results  Component Value Date   HDL 52 06/22/2017   HDL 47 06/30/2015   Lab Results  Component Value Date   LDLCALC 158 (H) 06/22/2017   LDLCALC 159 (H) 06/30/2015   Lab Results  Component Value Date   TRIG 95 06/22/2017   TRIG 81 06/30/2015   Lab Results  Component Value Date   CHOLHDL 4.4 06/22/2017   CHOLHDL 4.7 06/30/2015   No results found for: LDLDIRECT   Depression screen [facility] 2/9 06/23/2018 06/22/2017 06/13/2015  Decreased Interest 0 0 0  Down, Depressed, Hopeless 0 0 0  PHQ - 2 Score 0 0 0  Altered sleeping 0 - -    Tired, decreased energy 0 - -  Change in appetite 0 - -  Feeling bad or failure about yourself  0 - -  Trouble concentrating 0 - -  Moving slowly or fidgety/restless 0 - -  Suicidal thoughts 0 - -  PHQ-9 Score 0 - -  Difficult doing work/chores Not difficult at all - -   Fall Risk  06/23/2018 06/22/2017 06/13/2015  Falls in the past year? 0 No No  Number falls in past yr: 0 - -  Injury with Fall? 0 - -    Relevant past medical, surgical, family and social history reviewed Past Medical History:  Diagnosis Date  . Elevated LDL cholesterol level    Past Surgical History:  Procedure Laterality Date  . COLONOSCOPY WITH PROPOFOL N/A 07/06/2017   Procedure: COLONOSCOPY WITH PROPOFOL;  Surgeon: Lin Landsman, MD;  Location: [facility];  Service: Endoscopy;  Laterality: N/A;  . HEMORRHOID SURGERY     1990's  . PLANTAR FASCIA SURGERY  2007  . POLYPECTOMY  07/06/2017   Procedure: POLYPECTOMY;  Surgeon: Lin Landsman, MD;  Location: [facility];  Service: Endoscopy;;   Family History  Problem Relation Age of Onset  . Dementia Mother   . Diabetes Paternal Grandfather    Social History   Tobacco Use  . Smoking status: Never Smoker  . Smokeless tobacco: Never Used  Substance Use Topics  . Alcohol use: Yes    Alcohol/week: 2.0 standard drinks    Types: 1 Cans of beer, 1 Shots of liquor per week  . Drug use: No     [facility] Visit from 06/23/2018 in [facility]  AUDIT-C Score  1      Interim medical history since last visit reviewed. Allergies and medications reviewed  Review of Systems  HENT: Negative for hearing loss.   Eyes: Negative for visual disturbance.  Respiratory: Negative for cough.   Cardiovascular: Negative for chest pain.  Gastrointestinal: Negative for blood in stool.  Endocrine: Negative for polydipsia and polyuria.  Genitourinary: Negative for hematuria.  Musculoskeletal: Positive for arthralgias.  Skin:       No  worrisome moles  Allergic/Immunologic: Negative for food allergies.  Neurological: Negative for tremors.  Hematological: Negative for  adenopathy.  Psychiatric/Behavioral: Negative for dysphoric mood.   Per HPI unless specifically indicated above     Objective:    BP 120/70   Pulse 64   Temp 98 F (36.7 C) (Oral)   Ht 6\' 1"  (1.854 m)   Wt 218 lb 12.8 oz (99.2 kg)   SpO2 95%   BMI 28.87 kg/m   Wt Readings from Last 3 Encounters:  06/23/18 218 lb 12.8 oz (99.2 kg)  07/06/17 206 lb (93.4 kg)  06/22/17 209 lb 3.2 oz (94.9 kg)    Physical Exam Constitutional:      General: He is not in acute distress.    Appearance: He is well-developed. He is not diaphoretic.  HENT:     Head: Normocephalic and atraumatic.     Nose: Nose normal.  Eyes:     General: No scleral icterus. Neck:     Thyroid: No thyromegaly.     Vascular: No JVD.  Cardiovascular:     Rate and Rhythm: Normal rate and regular rhythm.     Heart sounds: Normal heart sounds.  Pulmonary:     Effort: Pulmonary effort is normal. No respiratory distress.     Breath sounds: Normal breath sounds. No wheezing or rales.  Abdominal:     General: Bowel sounds are normal. There is no distension.     Palpations: Abdomen is soft.     Tenderness: There is no abdominal tenderness. There is no guarding.  Musculoskeletal: Normal range of motion.     Left knee: He exhibits normal range of motion, no swelling, no effusion and no deformity. No tenderness found.  Lymphadenopathy:     Cervical: No cervical adenopathy.  Skin:    General: Skin is warm and dry.     Coloration: Skin is not pale.     Findings: No erythema or rash.  Neurological:     Mental Status: He is alert.     Motor: No abnormal muscle tone.     Coordination: Coordination normal.     Deep Tendon Reflexes: Reflexes normal.  Psychiatric:        Behavior: Behavior normal.        Thought Content: Thought content normal.        Judgment: Judgment normal.      Results for orders placed or performed in visit on 06/22/17  PSA  Result Value Ref Range   PSA 0.3 < OR = 4.0 ng/mL  COMPLETE METABOLIC PANEL WITH GFR  Result Value Ref Range   Glucose, Bld 108 (H) 65 - 99 mg/dL   BUN 17 7 - 25 mg/dL   Creat 1.12 0.70 - 1.33 mg/dL   GFR, Est Non African American 74 > OR = 60 mL/min/1.19m2   GFR, Est African American 85 > OR = 60 mL/min/1.28m2   BUN/Creatinine Ratio NOT APPLICABLE 6 - 22 (calc)   Sodium 139 135 - 146 mmol/L   Potassium 5.3 3.5 - 5.3 mmol/L   Chloride 102 98 - 110 mmol/L   CO2 30 20 - 32 mmol/L   Calcium 10.0 8.6 - 10.3 mg/dL   Total Protein 7.2 6.1 - 8.1 g/dL   Albumin 4.5 3.6 - 5.1 g/dL   Globulin 2.7 1.9 - 3.7 g/dL (calc)   AG Ratio 1.7 1.0 - 2.5 (calc)   Total Bilirubin 0.9 0.2 - 1.2 mg/dL   Alkaline phosphatase (APISO) 67 40 - 115 U/L   AST 22 10 - 35 U/L   ALT 33 9 - 46 U/L  Lipid panel  Result Value Ref Range   Cholesterol 230 (H) <200 mg/dL   HDL 52 >40 mg/dL   Triglycerides 95 <150 mg/dL   LDL Cholesterol (Calc) 158 (H) mg/dL (calc)   Total CHOL/HDL Ratio 4.4 <5.0 (calc)   Non-HDL Cholesterol (Calc) 178 (H) <130 mg/dL (calc)  Hepatitis C antibody  Result Value Ref Range   Hepatitis C Ab NON-REACTIVE NON-REACTI   SIGNAL TO CUT-OFF 0.12 <1.00  HIV antibody  Result Value Ref Range   HIV 1&2 Ab, 4th Generation NON-REACTIVE NON-REACTI      Assessment & Plan:   Problem List Items Addressed This Visit      Other   Screening for prostate cancer    Check PSA      Relevant Orders   PSA   Preventative health care - Primary    USPSTF grade A and B recommendations reviewed with patient; age-appropriate recommendations, preventive care, screening tests, etc discussed and encouraged; healthy living encouraged; see AVS for patient education given to patient       Relevant Orders   CBC with Differential/Platelet   COMPLETE METABOLIC PANEL WITH GFR   Lipid panel   TSH    Other Visit Diagnoses    Need for  shingles vaccine       Relevant Orders   Varicella-zoster vaccine IM (Shingrix) (Completed)   Need for Tdap vaccination       Relevant Orders   Tdap vaccine greater than or equal to 7yo IM (Completed)   Chronic pain of left knee       Relevant Medications   meloxicam (MOBIC) 7.5 MG tablet   Other Relevant Orders   DG Knee Complete 4 Views Left       Follow up plan: Return in about 1 year (around 06/24/2019) for complete physical.  An after-visit summary was printed and given to the patient at [facility].  Please see the patient instructions which may contain other information and recommendations beyond what is mentioned above in the assessment and plan.  Meds ordered this encounter  Medications  . meloxicam (MOBIC) 7.5 MG tablet    Sig: Take 1 tablet (7.5 mg total) by mouth daily as needed for pain. Okay for tylenol only, no other NSAIDs; take with food    Dispense:  30 tablet    Refill:  0    Orders Placed This Encounter  Procedures  . DG Knee Complete 4 Views Left  . Varicella-zoster vaccine IM (Shingrix)  . Tdap vaccine greater than or equal to 7yo IM  . CBC with Differential/Platelet  . COMPLETE METABOLIC PANEL WITH GFR  . Lipid panel  . TSH  . PSA

## 2018-06-23 NOTE — Assessment & Plan Note (Signed)
USPSTF grade A and B recommendations reviewed with patient; age-appropriate recommendations, preventive care, screening tests, etc discussed and encouraged; healthy living encouraged; see AVS for patient education given to patient  

## 2018-06-23 NOTE — Patient Instructions (Addendum)
Go across the street for xrays Try the new medicine, meloxicam Okay for tylenol but no other non-steroidal anti-inflammatories while on this Call me in 4 weeks if not getting better for ortho appointment   Check out the information at [email or web address].[email or web address] entitled "Nutrition for Weight Loss: What You Need to Know about Fad Diets" Try to lose between 1-2 pounds per week by taking in fewer calories and burning off more calories You can succeed by limiting portions, limiting foods dense in calories and fat, becoming more active, and drinking 8 glasses of water a day (64 ounces) Don't skip meals, especially breakfast, as skipping meals may alter your metabolism Do not use over-the-counter weight loss pills or gimmicks that claim rapid weight loss A healthy BMI (or body mass index) is between 18.5 and 24.9 You can calculate your ideal BMI at the [place] website [employer]  Start aspirin 81 mg daily after the course of meloxicam; if you want to start now, though, take aspirin at least one hour prior to the meloxicam   Health Maintenance, Male A healthy lifestyle and preventive care is important for your health and wellness. Ask your health care provider about what schedule of regular examinations is right for you. What should I know about weight and diet? Eat a Healthy Diet  Eat plenty of vegetables, fruits, whole grains, low-fat dairy products, and lean protein.  Do not eat a lot of foods high in solid fats, added sugars, or salt.  Maintain a Healthy Weight Regular exercise can help you achieve or maintain a healthy weight. You should:  Do at least 150 minutes of exercise each week. The exercise should increase your heart rate and make you sweat (moderate-intensity exercise).  Do strength-training exercises at least twice a week. Watch Your Levels of Cholesterol and Blood Lipids  Have your blood tested for lipids and cholesterol every 5  years starting at 57 years of age. If you are at high risk for heart disease, you should start having your blood tested when you are 57 years old. You may need to have your cholesterol levels checked more often if: ? Your lipid or cholesterol levels are high. ? You are older than 57 years of age. ? You are at high risk for heart disease. What should I know about cancer screening? Many types of cancers can be detected early and may often be prevented. Lung Cancer  You should be screened every year for lung cancer if: ? You are a current smoker who has smoked for at least 30 years. ? You are a former smoker who has quit within the past 15 years.  Talk to your health care provider about your screening options, when you should start screening, and how often you should be screened. Colorectal Cancer  Routine colorectal cancer screening usually begins at 57 years of age and should be repeated every 5-10 years until you are 57 years old. You may need to be screened more often if early forms of precancerous polyps or small growths are found. Your health care provider may recommend screening at an earlier age if you have risk factors for colon cancer.  Your health care provider may recommend using home test kits to check for hidden blood in the stool.  A small camera at the end of a tube can be used to examine your colon (sigmoidoscopy or colonoscopy). This checks for the earliest forms of colorectal cancer. Prostate and Testicular Cancer  Depending on your age and overall health, your health care  provider may do certain tests to screen for prostate and testicular cancer.  Talk to your health care provider about any symptoms or concerns you have about testicular or prostate cancer. Skin Cancer  Check your skin from head to toe regularly.  Tell your health care provider about any new moles or changes in moles, especially if: ? There is a change in a mole's size, shape, or color. ? You have a  mole that is larger than a pencil eraser.  Always use sunscreen. Apply sunscreen liberally and repeat throughout the day.  Protect yourself by wearing long sleeves, pants, a wide-brimmed hat, and sunglasses when outside. What should I know about heart disease, diabetes, and high blood pressure?  If you are 65-26 years of age, have your blood pressure checked every 3-5 years. If you are 1 years of age or older, have your blood pressure checked every year. You should have your blood pressure measured twice-once when you are at a hospital or clinic, and once when you are not at a hospital or clinic. Record the average of the two measurements. To check your blood pressure when you are not at a hospital or clinic, you can use: ? An automated blood pressure machine at a pharmacy. ? A home blood pressure monitor.  Talk to your health care provider about your target blood pressure.  If you are between 50-43 years old, ask your health care provider if you should take aspirin to prevent heart disease.  Have regular diabetes screenings by checking your fasting blood sugar level. ? If you are at a normal weight and have a low risk for diabetes, have this test once every three years after the age of 58. ? If you are overweight and have a high risk for diabetes, consider being tested at a younger age or more often.  A one-time screening for abdominal aortic aneurysm (AAA) by ultrasound is recommended for men aged 49-75 years who are current or former smokers. What should I know about preventing infection? Hepatitis B If you have a higher risk for hepatitis B, you should be screened for this virus. Talk with your health care provider to find out if you are at risk for hepatitis B infection. Hepatitis C Blood testing is recommended for:  Everyone born from [birth year] through 1964.  Anyone with known risk factors for hepatitis C. Sexually Transmitted Diseases (STDs)  You should be screened each year for  STDs including gonorrhea and chlamydia if: ? You are sexually active and are younger than 57 years of age. ? You are older than 57 years of age and your health care provider tells you that you are at risk for this type of infection. ? Your sexual activity has changed since you were last screened and you are at an increased risk for chlamydia or gonorrhea. Ask your health care provider if you are at risk.  Talk with your health care provider about whether you are at high risk of being infected with HIV. Your health care provider may recommend a prescription medicine to help prevent HIV infection. What else can I do?  Schedule regular health, dental, and eye exams.  Stay current with your vaccines (immunizations).  Do not use any tobacco products, such as cigarettes, chewing tobacco, and e-cigarettes. If you need help quitting, ask your health care provider.  Limit alcohol intake to no more than 2 drinks per day. One drink equals 12 ounces of beer, 5 ounces of wine, or 1 ounces of hard  liquor.  Do not use street drugs.  Do not share needles.  Ask your health care provider for help if you need support or information about quitting drugs.  Tell your health care provider if you often feel depressed.  Tell your health care provider if you have ever been abused or do not feel safe at home. This information is not intended to replace advice given to you by your health care provider. Make sure you discuss any questions you have with your health care provider. Document Released: 11/20/2007 Document Revised: 01/21/2016 Document Reviewed: 02/25/2015 Elsevier Interactive Patient Education  2019 [employer]  

## 2018-06-23 NOTE — Assessment & Plan Note (Signed)
Check PSA. ?

## 2018-06-26 ENCOUNTER — Other Ambulatory Visit: Payer: Self-pay | Admitting: Family Medicine

## 2018-06-26 DIAGNOSIS — E7800 Pure hypercholesterolemia, unspecified: Secondary | ICD-10-CM

## 2018-06-26 MED ORDER — ATORVASTATIN CALCIUM 10 MG PO TABS: 10.0000 mg | ORAL_TABLET | Freq: Every day | ORAL | 1 refills | Status: DC

## 2018-06-26 NOTE — Progress Notes (Signed)
Start statin; recheck lipids in 6 weeks

## 2019-02-09 ENCOUNTER — Ambulatory Visit: Payer: 59 | Admitting: Family Medicine

## 2019-06-29 ENCOUNTER — Encounter: Payer: 59 | Admitting: Family Medicine

## 2019-06-30 IMAGING — CR DG KNEE COMPLETE 4+V*L*
1 series · 4 of 4 positions shown · non-contrast
Comparison: None.

CLINICAL DATA: Chronic knee pain for 1 month, no known injury,
initial encounter

EXAM:
LEFT KNEE - COMPLETE 4+ VIEW

[Series 1: dg knee complete 4 views left · 0.14mm/px · 4 of 4 slices shown]
[im 1/4]
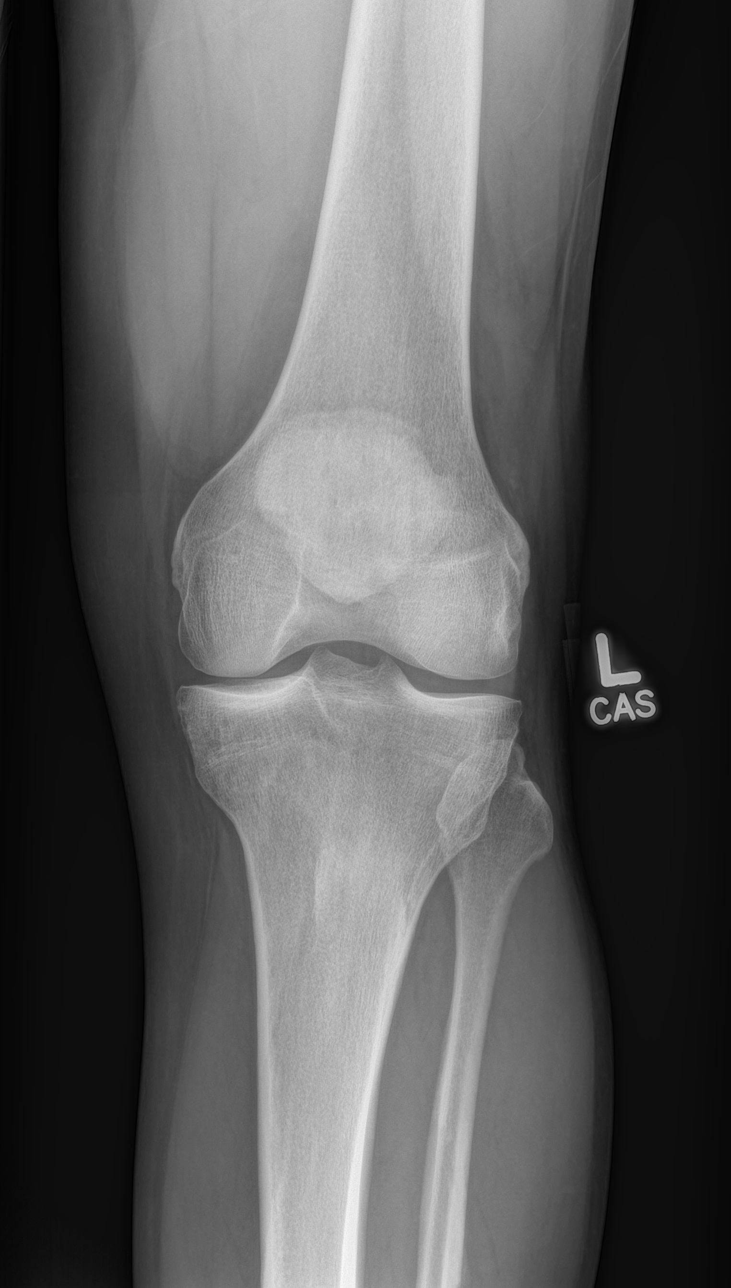
[im 2/4]
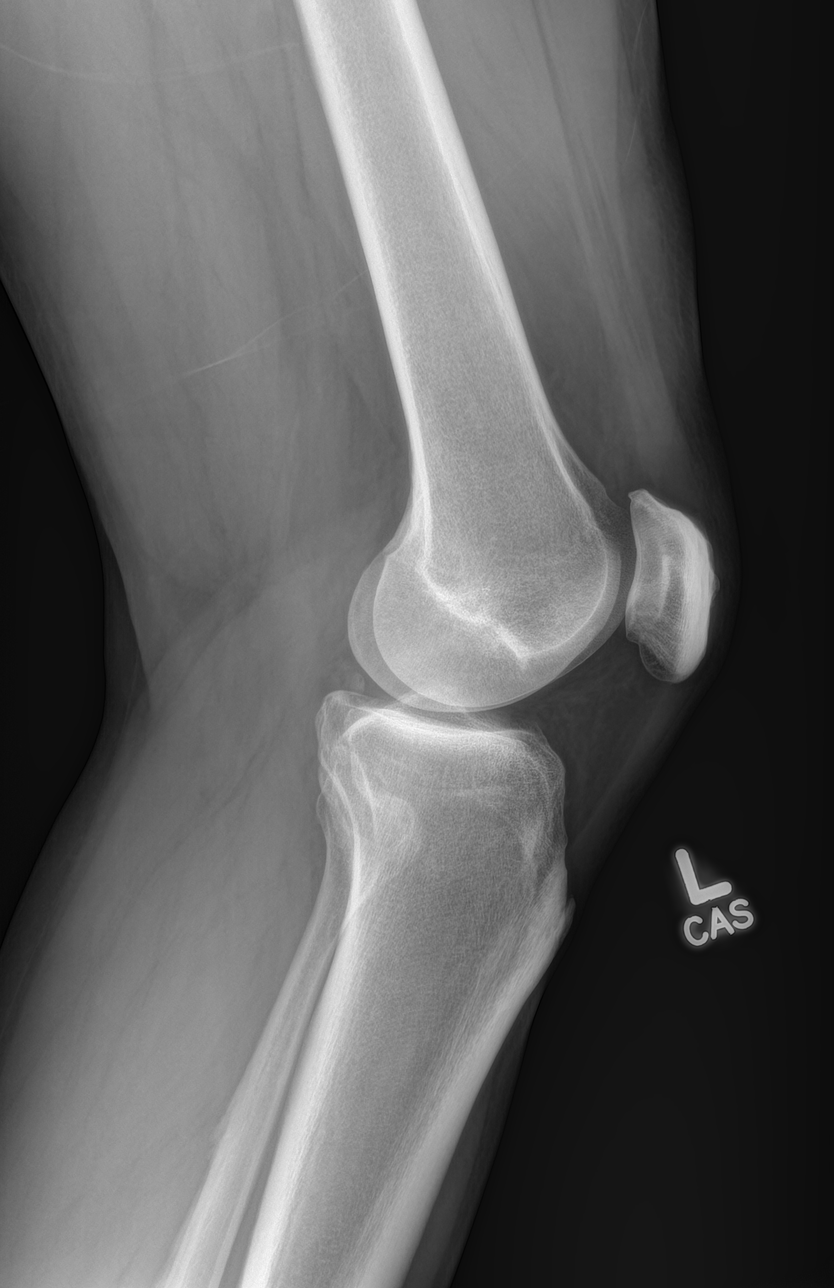
[im 3/4]
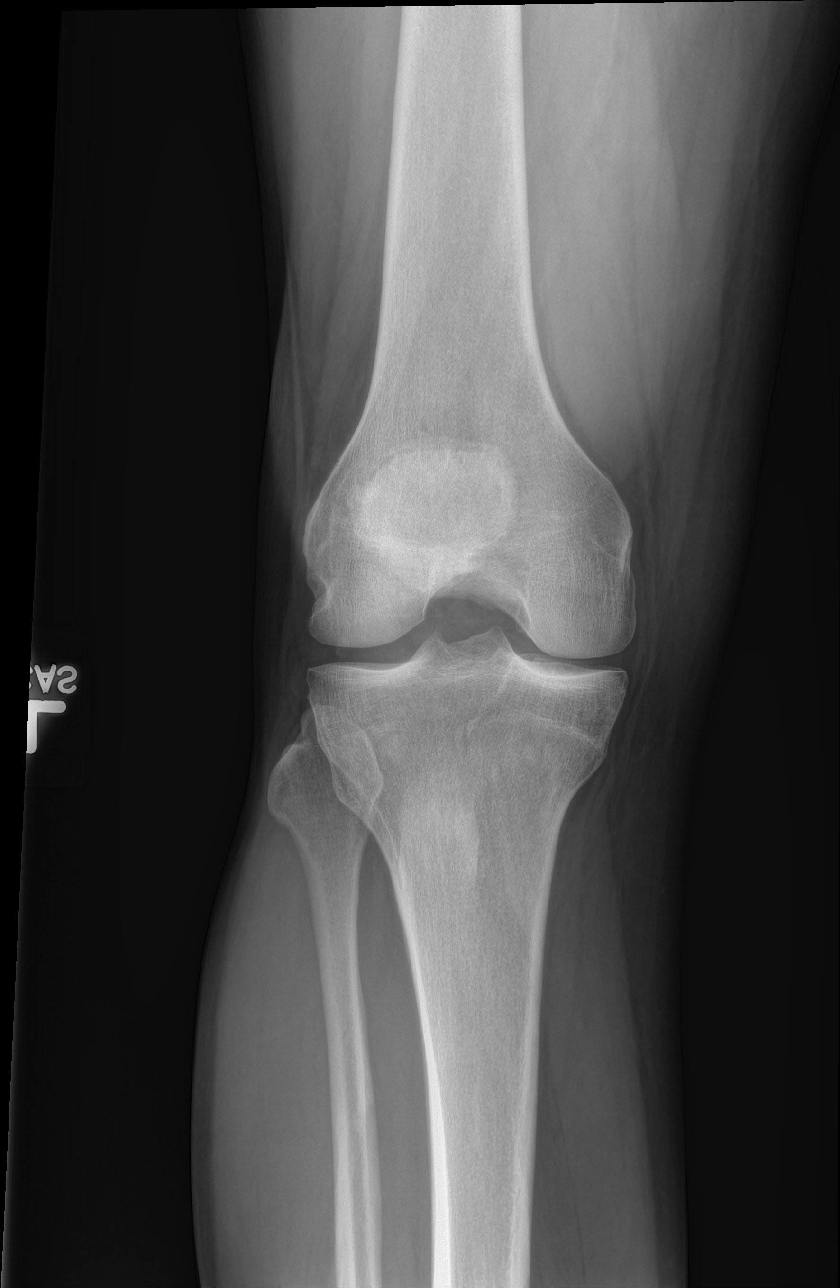
[im 4/4]
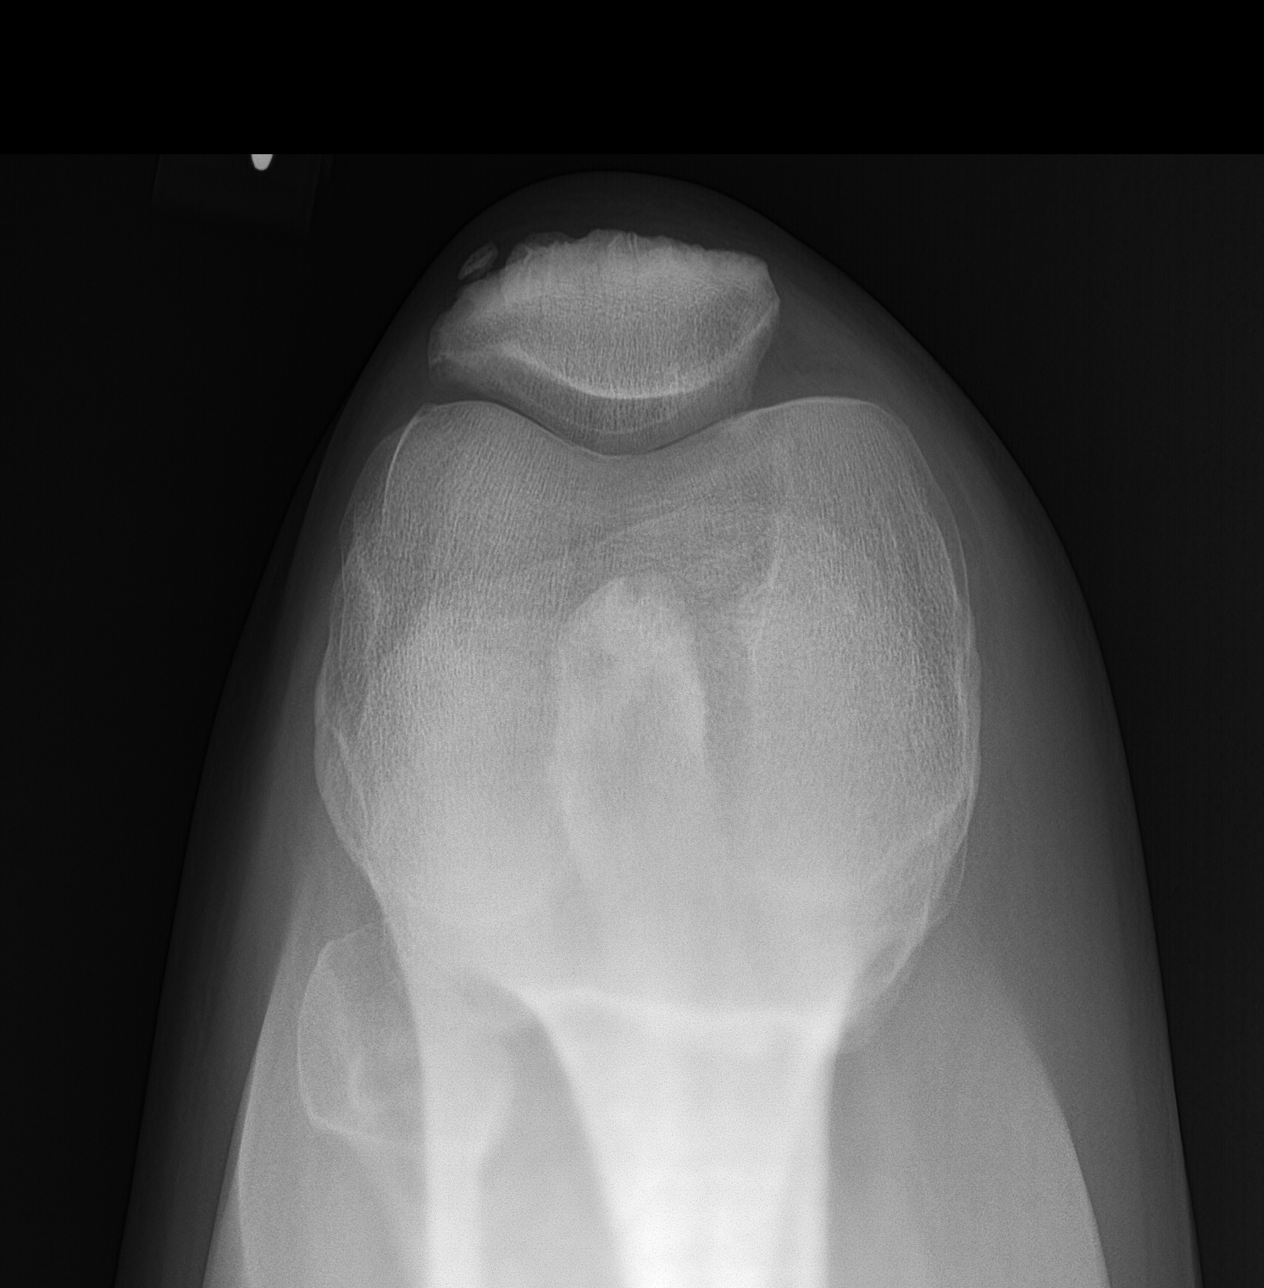

[4 of 4 positions shown; findings below may reference images not displayed]

FINDINGS: Mild degenerative changes are noted with medial joint space
narrowing. Minimal patellofemoral changes are noted. No joint
effusion is seen. No fracture is noted.
IMPRESSION: Mild degenerative change without acute abnormality.

## 2019-07-10 ENCOUNTER — Encounter: Payer: Self-pay | Admitting: Family Medicine

## 2019-07-10 ENCOUNTER — Ambulatory Visit (INDEPENDENT_AMBULATORY_CARE_PROVIDER_SITE_OTHER): Payer: 59 | Admitting: Family Medicine

## 2019-07-10 ENCOUNTER — Other Ambulatory Visit: Payer: Self-pay

## 2019-07-10 VITALS — BP 124/82 | HR 77 | Temp 97.3°F | Resp 18 | Ht 73.0 in | Wt 219.2 lb

## 2019-07-10 DIAGNOSIS — Z125 Encounter for screening for malignant neoplasm of prostate: Secondary | ICD-10-CM

## 2019-07-10 DIAGNOSIS — E7800 Pure hypercholesterolemia, unspecified: Secondary | ICD-10-CM | POA: Diagnosis not present

## 2019-07-10 DIAGNOSIS — R7301 Impaired fasting glucose: Secondary | ICD-10-CM

## 2019-07-10 DIAGNOSIS — Z0000 Encounter for general adult medical examination without abnormal findings: Secondary | ICD-10-CM | POA: Diagnosis not present

## 2019-07-10 NOTE — Patient Instructions (Signed)
Preventive Care 41-58 Years Old, Male Preventive care refers to lifestyle choices and visits with your health care provider that can promote health and wellness. This includes:  A yearly physical exam. This is also called an annual well check.  Regular dental and eye exams.  Immunizations.  Screening for certain conditions.  Healthy lifestyle choices, such as eating a healthy diet, getting regular exercise, not using drugs or products that contain nicotine and tobacco, and limiting alcohol use. What can I expect for my preventive care visit? Physical exam Your health care provider will check:  Height and weight. These may be used to calculate body mass index (BMI), which is a measurement that tells if you are at a healthy weight.  Heart rate and blood pressure.  Your skin for abnormal spots. Counseling Your health care provider may ask you questions about:  Alcohol, tobacco, and drug use.  Emotional well-being.  Home and relationship well-being.  Sexual activity.  Eating habits.  Work and work Statistician. What immunizations do I need?  Influenza (flu) vaccine  This is recommended every year. Tetanus, diphtheria, and pertussis (Tdap) vaccine  You may need a Td booster every 10 years. Varicella (chickenpox) vaccine  You may need this vaccine if you have not already been vaccinated. Zoster (shingles) vaccine  You may need this after age 64. Measles, mumps, and rubella (MMR) vaccine  You may need at least one dose of MMR if you were born in 1956 or later. You may also need a second dose. Pneumococcal conjugate (PCV13) vaccine  You may need this if you have certain conditions and were not previously vaccinated. Pneumococcal polysaccharide (PPSV23) vaccine  You may need one or two doses if you smoke cigarettes or if you have certain conditions. Meningococcal conjugate (MenACWY) vaccine  You may need this if you have certain conditions. Hepatitis A  vaccine  You may need this if you have certain conditions or if you travel or work in places where you may be exposed to hepatitis A. Hepatitis B vaccine  You may need this if you have certain conditions or if you travel or work in places where you may be exposed to hepatitis B. Haemophilus influenzae type b (Hib) vaccine  You may need this if you have certain risk factors. Human papillomavirus (HPV) vaccine  If recommended by your health care provider, you may need three doses over 6 months. You may receive vaccines as individual doses or as more than one vaccine together in one shot (combination vaccines). Talk with your health care provider about the risks and benefits of combination vaccines. What tests do I need? Blood tests  Lipid and cholesterol levels. These may be checked every 5 years, or more frequently if you are over 60 years old.  Hepatitis C test.  Hepatitis B test. Screening  Lung cancer screening. You may have this screening every year starting at age 43 if you have a 30-pack-year history of smoking and currently smoke or have quit within the past 15 years.  Prostate cancer screening. Recommendations will vary depending on your family history and other risks.  Colorectal cancer screening. All adults should have this screening starting at age 72 and continuing until age 2. Your health care provider may recommend screening at age 14 if you are at increased risk. You will have tests every 1-10 years, depending on your results and the type of screening test.  Diabetes screening. This is done by checking your blood sugar (glucose) after you have not eaten  for a while (fasting). You may have this done every 1-3 years.  Sexually transmitted disease (STD) testing. Follow these instructions at home: Eating and drinking  Eat a diet that includes fresh fruits and vegetables, whole grains, lean protein, and low-fat dairy products.  Take vitamin and mineral supplements as  recommended by your health care provider.  Do not drink alcohol if your health care provider tells you not to drink.  If you drink alcohol: ? Limit how much you have to 0-2 drinks a day. ? Be aware of how much alcohol is in your drink. In the [place] one drink equals one 12 oz bottle of beer (355 mL), one 5 oz glass of wine (148 mL), or one 1 oz glass of hard liquor (44 mL). Lifestyle  Take daily care of your teeth and gums.  Stay active. Exercise for at least 30 minutes on 5 or more days each week.  Do not use any products that contain nicotine or tobacco, such as cigarettes, e-cigarettes, and chewing tobacco. If you need help quitting, ask your health care provider.  If you are sexually active, practice safe sex. Use a condom or other form of protection to prevent STIs (sexually transmitted infections).  Talk with your health care provider about taking a low-dose aspirin every day starting at age 50. What's next?  Go to your health care provider once a year for a well check visit.  Ask your health care provider how often you should have your eyes and teeth checked.  Stay up to date on all vaccines. This information is not intended to replace advice given to you by your health care provider. Make sure you discuss any questions you have with your health care provider. Document Revised: 05/18/2018 Document Reviewed: 05/18/2018 Elsevier Patient Education  2020 [employer]  

## 2019-07-10 NOTE — Progress Notes (Signed)
Name: Daniel Tapia   MRN: [id number]    DOB: Jul 06, 1960   Date:07/10/2019       Progress Note  Subjective  Chief Complaint  Chief Complaint  Patient presents with  . Annual Exam    HPI  Patient presents for annual CPE.  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  Exercise: Walking about 1-1.5 miles a day.   Depression: phq 9 is negative Depression screen [facility] 2/9 07/10/2019 06/23/2018 06/22/2017 06/13/2015  Decreased Interest 0 0 0 0  Down, Depressed, Hopeless 0 0 0 0  PHQ - 2 Score 0 0 0 0  Altered sleeping 0 0 - -  Tired, decreased energy 0 0 - -  Change in appetite 0 0 - -  Feeling bad or failure about yourself  0 0 - -  Trouble concentrating 0 0 - -  Moving slowly or fidgety/restless 0 0 - -  Suicidal thoughts 0 0 - -  PHQ-9 Score 0 0 - -  Difficult doing work/chores Not difficult at all Not difficult at all - -    Hypertension:  BP Readings from Last 3 Encounters:  07/10/19 124/82  06/23/18 120/70  07/06/17 112/70    Obesity: Wt Readings from Last 3 Encounters:  07/10/19 219 lb 3.2 oz (99.4 kg)  06/23/18 218 lb 12.8 oz (99.2 kg)  07/06/17 206 lb (93.4 kg)   BMI Readings from Last 3 Encounters:  07/10/19 28.92 kg/m  06/23/18 28.87 kg/m  07/06/17 27.18 kg/m     Lipids:  Lab Results  Component Value Date   CHOL 238 (H) 06/23/2018   CHOL 230 (H) 06/22/2017   CHOL 222 (H) 06/30/2015   Lab Results  Component Value Date   HDL 43 06/23/2018   HDL 52 06/22/2017   HDL 47 06/30/2015   Lab Results  Component Value Date   LDLCALC 165 (H) 06/23/2018   LDLCALC 158 (H) 06/22/2017   LDLCALC 159 (H) 06/30/2015   Lab Results  Component Value Date   TRIG 153 (H) 06/23/2018   TRIG 95 06/22/2017   TRIG 81 06/30/2015   Lab Results  Component Value Date   CHOLHDL 5.5 (H) 06/23/2018   CHOLHDL 4.4 06/22/2017   CHOLHDL 4.7 06/30/2015   No results found for: LDLDIRECT Glucose:  Glucose  Date Value  Ref Range Status  06/30/2015 109 (H) 65 - 99 mg/dL Final   Glucose, Bld  Date Value Ref Range Status  06/23/2018 101 (H) 65 - 99 mg/dL Final    Comment:    .            Fasting reference interval . For someone without known diabetes, a glucose value between 100 and 125 mg/dL is consistent with prediabetes and should be confirmed with a follow-up test. .   06/22/2017 108 (H) 65 - 99 mg/dL Final    Comment:    .            Fasting reference interval . For someone without known diabetes, a glucose value between 100 and 125 mg/dL is consistent with prediabetes and should be confirmed with a follow-up test. .       [facility] Visit from 07/10/2019 in [facility]  AUDIT-C Score  1    Drinking weekly - 1-2 drinks on Fridays/Saturdays  Married STD testing and prevention (HIV/chl/gon/syphilis): No new partners in the past year. HIV UTD Hep C: UTD  Skin cancer: Discussed  monitoring for atypical lesions.  Denies any concerning lesions Colorectal cancer: Denies family or personal history of colorectal cancer, no changes in BM's - no blood in stool, dark and tarry stool, mucus in stool, or constipation/diarrhea. Colonoscopy is UTD. Prostate cancer: We will check today Lab Results  Component Value Date   PSA 0.3 06/23/2018   PSA 0.3 06/22/2017    IPSS Questionnaire (AUA-7): Over the past month.   1)  How often have you had a sensation of not emptying your bladder completely after you finish urinating?  0 - Not at all  2)  How often have you had to urinate again less than two hours after you finished urinating? 0 - Not at all  3)  How often have you found you stopped and started again several times when you urinated?  0 - Not at all  4) How difficult have you found it to postpone urination?  0 - Not at all  5) How often have you had a weak urinary stream?  0 - Not at all  6) How often have you had to push or strain to begin urination?  0 - Not at all  7) How many  times did you most typically get up to urinate from the time you went to bed until the time you got up in the morning?  1 - 1 time  Total score:  0-7 mildly symptomatic   8-19 moderately symptomatic   20-35 severely symptomatic   Lung cancer:  Never smoker.  Low Dose CT Chest recommended if Age 24-80 years, 30 pack-year currently smoking OR have quit w/in 15years. Patient does not qualify.   AAA: N/A The USPSTF recommends one-time screening with ultrasonography in men ages 42 to 29 years who have ever smoked ECG:  Denies chest pain, shortness of breath, palpitations.  Advanced Care Planning: A voluntary discussion about advance care planning including the explanation and discussion of advance directives.  Discussed health care proxy and Living will, and the patient was able to identify a health care proxy as Wife - [facility].  Patient does not have a living will at present time. If patient does have living will, I have requested they bring this to the clinic to be scanned in to their chart.  Patient Active Problem List   Diagnosis Date Noted  . Preventative health care 06/23/2018  . Screening for prostate cancer 06/23/2018  . Special screening for malignant neoplasms, colon     Past Surgical History:  Procedure Laterality Date  . COLONOSCOPY WITH PROPOFOL N/A 07/06/2017   Procedure: COLONOSCOPY WITH PROPOFOL;  Surgeon: Lin Landsman, MD;  Location: [place];  Service: Endoscopy;  Laterality: N/A;  . HEMORRHOID SURGERY     1990's  . PLANTAR FASCIA SURGERY  2007  . POLYPECTOMY  07/06/2017   Procedure: POLYPECTOMY;  Surgeon: Lin Landsman, MD;  Location: [place];  Service: Endoscopy;;    Family History  Problem Relation Age of Onset  . Dementia Mother   . Diabetes Paternal Grandfather     Social History   Socioeconomic History  . Marital status: Married    Spouse name: carman  . Number of children: 4  . Years of education: 83  .  Highest education level: Associate degree: academic program  Occupational History  . Occupation: [employer]  Tobacco Use  . Smoking status: Never Smoker  . Smokeless tobacco: Never Used  Substance and Sexual Activity  . Alcohol use: Yes  Alcohol/week: 2.0 standard drinks    Types: 1 Cans of beer, 1 Shots of liquor per week  . Drug use: No  . Sexual activity: Yes  Other Topics Concern  . 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 Sometimes back home at night, sometimes he's gone for a week at a time. Married with 4 children; 1 grandchild.   Social Determinants of Health   Financial Resource Strain:   . Difficulty of Paying Living Expenses: Not on file  Food Insecurity:   . Worried About Charity fundraiser in the Last Year: Not on file  . Ran Out of Food in the Last Year: Not on file  Transportation Needs:   . Lack of Transportation (Medical): Not on file  . Lack of Transportation (Non-Medical): Not on file  Physical Activity:   . Days of Exercise per Week: Not on file  . Minutes of Exercise per Session: Not on file  Stress:   . Feeling of Stress : Not on file  Social Connections:   . Frequency of Communication with Friends and Family: Not on file  . Frequency of Social Gatherings with Friends and Family: Not on file  . Attends Religious Services: Not on file  . Active Member of Clubs or Organizations: Not on file  . Attends Archivist Meetings: Not on file  . Marital Status: Not on file  Intimate Partner Violence:   . Fear of Current or Ex-Partner: Not on file  . Emotionally Abused: Not on file  . Physically Abused: Not on file  . Sexually Abused: Not on file     Current Outpatient Medications:  .  atorvastatin (LIPITOR) 10 MG tablet, Take 1 tablet (10 mg total) by mouth at bedtime., Disp: 30 tablet, Rfl: 1 .  meloxicam (MOBIC) 7.5 MG tablet, Take 1 tablet (7.5 mg total) by mouth daily  as needed for pain. Okay for tylenol only, no other NSAIDs; take with food, Disp: 30 tablet, Rfl: 0  No Known Allergies  ROS  Constitutional: Negative for fever or weight change.  Respiratory: Negative for cough and shortness of breath.   Cardiovascular: Negative for chest pain or palpitations.  Gastrointestinal: Negative for abdominal pain, no bowel changes.  Musculoskeletal: Negative for gait problem or joint swelling.  Skin: Negative for rash.  Neurological: Negative for dizziness or headache.  No other specific complaints in a complete review of systems (except as listed in HPI above).  Objective  Vitals:   07/10/19 0843  BP: 124/82  Pulse: 77  Resp: 18  Temp: (!) 97.3 F (36.3 C)  TempSrc: Temporal  SpO2: 96%  Weight: 219 lb 3.2 oz (99.4 kg)  Height: 6\' 1"  (1.854 m)    Body mass index is 28.92 kg/m.  Physical Exam  Constitutional: Patient appears well-developed and well-nourished. No distress.  HENT: Head: Normocephalic and atraumatic. Ears: B TMs ok, no erythema or effusion; Nose: Nose normal. Mouth/Throat: Oropharynx is clear and moist. No oropharyngeal exudate.  Eyes: Conjunctivae and EOM are normal. Pupils are equal, round, and reactive to light. No scleral icterus.  Neck: Normal range of motion. Neck supple. No JVD present. No thyromegaly present.  Cardiovascular: Normal rate, regular rhythm and normal heart sounds.  No murmur heard. No BLE edema. Pulmonary/Chest: Effort normal and breath sounds normal. No respiratory distress. Abdominal: Soft. Bowel sounds are normal, no distension. There is no tenderness. no masses MALE GENITALIA: Deferred  RECTAL: Deferred Musculoskeletal: Normal range of motion, no joint effusions. No gross deformities Neurological: he is alert and oriented to person, place, and time. No cranial nerve deficit. Coordination, balance, strength, speech and gait are normal.  Skin: Skin is warm and dry. No rash noted. No erythema.  Psychiatric:  Patient has a normal mood and affect. behavior is normal. Judgment and thought content normal.  No results found for this or any previous visit (from the past 2160 hour(s)).   PHQ2/9: Depression screen [facility] 2/9 07/10/2019 06/23/2018 06/22/2017 06/13/2015  Decreased Interest 0 0 0 0  Down, Depressed, Hopeless 0 0 0 0  PHQ - 2 Score 0 0 0 0  Altered sleeping 0 0 - -  Tired, decreased energy 0 0 - -  Change in appetite 0 0 - -  Feeling bad or failure about yourself  0 0 - -  Trouble concentrating 0 0 - -  Moving slowly or fidgety/restless 0 0 - -  Suicidal thoughts 0 0 - -  PHQ-9 Score 0 0 - -  Difficult doing work/chores Not difficult at all Not difficult at all - -    Fall Risk: Fall Risk  07/10/2019 06/23/2018 06/22/2017 06/13/2015  Falls in the past year? 0 0 No No  Number falls in past yr: 0 0 - -  Injury with Fall? 0 0 - -  Follow up Falls evaluation completed - - -   Assessment & Plan  1. Annual physical exam -Prostate cancer screening and PSA options (with potential risks and benefits of testing vs not testing) were discussed along with recent recs/guidelines. -USPSTF grade A and B recommendations reviewed with patient; age-appropriate recommendations, preventive care, screening tests, etc discussed and encouraged; healthy living encouraged; see AVS for patient education given to patient -Discussed importance of 150 minutes of physical activity weekly, eat two servings of fish weekly, eat one serving of tree nuts ( cashews, pistachios, pecans, almonds.Marland Kitchen) every other day, eat 6 servings of fruit/vegetables daily and drink plenty of water and avoid sweet beverages.  - COMPLETE METABOLIC PANEL WITH GFR - Lipid panel - PSA - Hemoglobin A1c  2. Elevated LDL cholesterol level - Lipid panel  3. IFG (impaired fasting glucose) - COMPLETE METABOLIC PANEL WITH GFR - Hemoglobin A1c  4. Prostate cancer screening - PSA

## 2019-07-11 LAB — COMPLETE METABOLIC PANEL WITH GFR
AG Ratio: 1.7 (calc) (ref 1.0–2.5)
ALT: 58 U/L — ABNORMAL HIGH (ref 9–46)
AST: 37 U/L — ABNORMAL HIGH (ref 10–35)
Albumin: 4.3 g/dL (ref 3.6–5.1)
Alkaline phosphatase (APISO): 62 U/L (ref 35–144)
BUN: 16 mg/dL (ref 7–25)
CO2: 30 mmol/L (ref 20–32)
Calcium: 9.5 mg/dL (ref 8.6–10.3)
Chloride: 103 mmol/L (ref 98–110)
Creat: 1.1 mg/dL (ref 0.70–1.33)
GFR, Est African American: 86 mL/min/{1.73_m2} (ref >=60)
GFR, Est Non African American: 74 mL/min/{1.73_m2} (ref >=60)
Globulin: 2.5 g/dL (calc) (ref 1.9–3.7)
Glucose, Bld: 115 mg/dL — ABNORMAL HIGH (ref 65–99)
Potassium: 4.5 mmol/L (ref 3.5–5.3)
Sodium: 139 mmol/L (ref 135–146)
Total Bilirubin: 0.6 mg/dL (ref 0.2–1.2)
Total Protein: 6.8 g/dL (ref 6.1–8.1)

## 2019-07-11 LAB — HEMOGLOBIN A1C
Hgb A1c MFr Bld: 6.3 % of total Hgb — ABNORMAL HIGH (ref <5.7)
Mean Plasma Glucose: 134 (calc)
eAG (mmol/L): 7.4 (calc)

## 2019-07-11 LAB — LIPID PANEL
Cholesterol: 219 mg/dL — ABNORMAL HIGH (ref <200)
HDL: 53 mg/dL (ref >=40)
LDL Cholesterol (Calc): 144 mg/dL (calc) — ABNORMAL HIGH
Non-HDL Cholesterol (Calc): 166 mg/dL (calc) — ABNORMAL HIGH (ref <130)
Total CHOL/HDL Ratio: 4.1 (calc) (ref <5.0)
Triglycerides: 107 mg/dL (ref <150)

## 2019-07-11 LAB — PSA: PSA: 0.3 ng/mL (ref <=4.0)

## 2020-07-10 ENCOUNTER — Encounter: Payer: 59 | Admitting: Family Medicine

## 2022-04-16 NOTE — Patient Instructions (Incomplete)

## 2022-04-19 ENCOUNTER — Encounter: Payer: 59 | Admitting: Family Medicine

## 2022-04-19 DIAGNOSIS — R7301 Impaired fasting glucose: Secondary | ICD-10-CM

## 2022-04-19 DIAGNOSIS — Z125 Encounter for screening for malignant neoplasm of prostate: Secondary | ICD-10-CM

## 2022-04-19 DIAGNOSIS — Z23 Encounter for immunization: Secondary | ICD-10-CM

## 2022-04-19 DIAGNOSIS — Z0000 Encounter for general adult medical examination without abnormal findings: Secondary | ICD-10-CM

## 2022-04-19 DIAGNOSIS — E7800 Pure hypercholesterolemia, unspecified: Secondary | ICD-10-CM

## 2022-05-19 ENCOUNTER — Ambulatory Visit (INDEPENDENT_AMBULATORY_CARE_PROVIDER_SITE_OTHER): Payer: 59 | Admitting: Family Medicine

## 2022-05-19 ENCOUNTER — Encounter: Payer: Self-pay | Admitting: Family Medicine

## 2022-05-19 VITALS — BP 124/72 | HR 64 | Temp 98.2°F | Resp 16 | Ht 72.0 in | Wt 225.1 lb

## 2022-05-19 DIAGNOSIS — Z0000 Encounter for general adult medical examination without abnormal findings: Secondary | ICD-10-CM | POA: Diagnosis not present

## 2022-05-19 DIAGNOSIS — E669 Obesity, unspecified: Secondary | ICD-10-CM

## 2022-05-19 DIAGNOSIS — E785 Hyperlipidemia, unspecified: Secondary | ICD-10-CM

## 2022-05-19 DIAGNOSIS — Z23 Encounter for immunization: Secondary | ICD-10-CM | POA: Diagnosis not present

## 2022-05-19 DIAGNOSIS — Z125 Encounter for screening for malignant neoplasm of prostate: Secondary | ICD-10-CM | POA: Diagnosis not present

## 2022-05-19 DIAGNOSIS — Z6830 Body mass index (BMI) 30.0-30.9, adult: Secondary | ICD-10-CM

## 2022-05-19 LAB — CBC WITH DIFFERENTIAL/PLATELET
Basophils Absolute: 41 cells/uL (ref 0–200)
Basophils Relative: 0.9 %
Eosinophils Absolute: 152 cells/uL (ref 15–500)
Eosinophils Relative: 3.3 %
HCT: 43.7 % (ref 38.5–50.0)
Hemoglobin: 14.5 g/dL (ref 13.2–17.1)
Lymphs Abs: 1670 cells/uL (ref 850–3900)
MCH: 27.5 pg (ref 27.0–33.0)
MCHC: 33.2 g/dL (ref 32.0–36.0)
MPV: 10.2 fL (ref 7.5–12.5)
Monocytes Relative: 9 %
Neutrophils Relative %: 50.5 %
WBC: 4.6 10*3/uL (ref 3.8–10.8)

## 2022-05-19 NOTE — Progress Notes (Signed)
Patient: Daniel Tapia, Male    DOB: Jun 24, 1960, 61 y.o.   MRN: [id number] Delsa Grana, PA-C Visit Date: 05/19/2022  Today's Provider: Delsa Grana, PA-C   Chief Complaint  Patient presents with   Annual Exam   Subjective:   Annual physical exam:  Daniel Tapia is a 61 y.o. male who presents today for health maintenance and annual & complete physical exam.   Exercise/Activity:  physical work, walking all day long 10000-12000 steps daily Diet/nutrition:  healthy Sleep: no concerns  [place]: No Food Insecurity (05/19/2022)  Housing: Low Risk  (05/19/2022)  Transportation Needs: No Transportation Needs (05/19/2022)  Utilities: Not At Risk (05/19/2022)  Alcohol Screen: Low Risk  (05/19/2022)  Depression (PHQ2-9): Low Risk  (05/19/2022)  Financial Resource Strain: Low Risk  (05/19/2022)  Physical Activity: Sufficiently Active (05/19/2022)  Social Connections: Moderately Integrated (05/19/2022)  Stress: No Stress Concern Present (05/19/2022)  Tobacco Use: Low Risk  (05/19/2022)    USPSTF grade A and B recommendations - reviewed and addressed today  Depression:  Phq 9 completed today by patient, was reviewed by me with patient in the room, score is  negative, pt feels good    05/19/2022    2:25 PM 07/10/2019    8:48 AM 06/23/2018    8:51 AM  Depression screen PHQ 2/9  Decreased Interest 0 0 0  Down, Depressed, Hopeless 0 0 0  PHQ - 2 Score 0 0 0  Altered sleeping 0 0 0  Tired, decreased energy 0 0 0  Change in appetite 0 0 0  Feeling bad or failure about yourself  0 0 0  Trouble concentrating 0 0 0  Moving slowly or fidgety/restless 0 0 0  Suicidal thoughts 0 0 0  PHQ-9 Score 0 0 0  Difficult doing work/chores Not difficult at all Not difficult at all Not difficult at all    Hep C Screening: done  STD testing and prevention (HIV/chl/gon/syphilis):      Intimate partner violence:  denies  Advanced Care Planning:  A  voluntary discussion about advance care planning including the explanation and discussion of advance directives.  Discussed health care proxy and Living will, and the patient was able to identify a health care proxy as wife.  Patient does not have a living will at present time. If patient does have living will, I have requested they bring this to the clinic to be scanned in to their chart.  Health Maintenance  Topic Date Due   COVID-19 Vaccine (3 - 2023-24 season) 06/04/2022 (Originally 02/05/2022)   Zoster Vaccines- Shingrix (2 of 2) 08/18/2022 (Originally 08/18/2018)   INFLUENZA VACCINE  09/05/2022 (Originally 01/05/2022)   COLONOSCOPY (Pts 45-76yr Insurance coverage will need to be confirmed)  07/06/2022   DTaP/Tdap/Td (2 - Td or Tdap) 06/23/2028   Hepatitis C Screening  Completed   HIV Screening  Completed   HPV VACCINES  Aged Out    Skin cancer:  Pt reports no hx of skin cancer, suspicious lesions/biopsies in the past.  Colorectal cancer:  colonoscopy is due in Jan Pt denies change in bowels, blood in stool  Prostate cancer:  Prostate cancer screening with PSA: Discussed risks and benefits of PSA testing and provided handout. Pt declines to have PSA drawn today. No sx, no family hx, prior normal psa, will wait another year Lab Results  Component Value Date   PSA 0.3 07/10/2019   PSA 0.3 06/23/2018   PSA 0.3 06/22/2017  Urinary Symptoms:   IPSS     Row Name 05/19/22 1442         International Prostate Symptom Score   How often have you had the sensation of not emptying your bladder? Not at All     How often have you had to urinate less than every two hours? Not at All     How often have you found you stopped and started again several times when you urinated? Less than 1 in 5 times     How often have you found it difficult to postpone urination? Not at All     How often have you had a weak urinary stream? Not at All     How often have you had to strain to start urination?  Not at All     How many times did you typically get up at night to urinate? 1 Time     Total IPSS Score 2       Quality of Life due to urinary symptoms   If you were to spend the rest of your life with your urinary condition just the way it is now how would you feel about that? Mostly Satisfied              Lung cancer:   Low Dose CT Chest recommended if Age 41-80 years, 20 pack-year currently smoking OR have quit w/in 15years. Patient  qualify.   Social History   Tobacco Use   Smoking status: Never   Smokeless tobacco: Never  Substance Use Topics   Alcohol use: Yes    Alcohol/week: 2.0 standard drinks of alcohol    Types: 1 Cans of beer, 1 Shots of liquor per week     Alcohol screening: [place] [facility] Visit from 05/19/2022 in [facility]  AUDIT-C Score 4       AAA:  The USPSTF recommends one-time screening with ultrasonography in men ages 63 to 59 years who have ever smoked  ECG: n/a or indicated today  Blood pressure/Hypertension: BP Readings from Last 3 Encounters:  05/19/22 124/72  07/10/19 124/82  06/23/18 120/70   Weight/Obesity: Wt Readings from Last 3 Encounters:  05/19/22 225 lb 1.6 oz (102.1 kg)  07/10/19 219 lb 3.2 oz (99.4 kg)  06/23/18 218 lb 12.8 oz (99.2 kg)   BMI Readings from Last 3 Encounters:  05/19/22 30.53 kg/m  07/10/19 28.92 kg/m  06/23/18 28.87 kg/m    Lipids:  Lab Results  Component Value Date   CHOL 219 (H) 07/10/2019   CHOL 238 (H) 06/23/2018   CHOL 230 (H) 06/22/2017   Lab Results  Component Value Date   HDL 53 07/10/2019   HDL 43 06/23/2018   HDL 52 06/22/2017   Lab Results  Component Value Date   LDLCALC 144 (H) 07/10/2019   LDLCALC 165 (H) 06/23/2018   LDLCALC 158 (H) 06/22/2017   Lab Results  Component Value Date   TRIG 107 07/10/2019   TRIG 153 (H) 06/23/2018   TRIG 95 06/22/2017   Lab Results  Component Value Date   CHOLHDL 4.1 07/10/2019   CHOLHDL 5.5 (H) 06/23/2018    CHOLHDL 4.4 06/22/2017   No results found for: "LDLDIRECT" Based on the results of lipid panel his/her cardiovascular risk factor ( using [place] )  in the next 10 years is : The 10-year ASCVD risk score (Arnett DK, et al., 2019) is: 7.8%   Values used to calculate the score:  Age: 84 years     Sex: Male     Is Non-Hispanic African American: Yes     Diabetic: No     Tobacco smoker: No     Systolic Blood Pressure: 564 mmHg     Is BP treated: No     HDL Cholesterol: 53 mg/dL     Total Cholesterol: 219 mg/dL Glucose:  Glucose, Bld  Date Value Ref Range Status  07/10/2019 115 (H) 65 - 99 mg/dL Final    Comment:    .            Fasting reference interval . For someone without known diabetes, a glucose value between 100 and 125 mg/dL is consistent with prediabetes and should be confirmed with a follow-up test. .   06/23/2018 101 (H) 65 - 99 mg/dL Final    Comment:    .            Fasting reference interval . For someone without known diabetes, a glucose value between 100 and 125 mg/dL is consistent with prediabetes and should be confirmed with a follow-up test. .   06/22/2017 108 (H) 65 - 99 mg/dL Final    Comment:    .            Fasting reference interval . For someone without known diabetes, a glucose value between 100 and 125 mg/dL is consistent with prediabetes and should be confirmed with a follow-up test. .     Social History       Social History   Socioeconomic History   Marital status: Married    Spouse name: carman   Number of children: 4   Years of education: 12   Highest education level: Associate degree: academic program  Occupational History   Occupation: [employer]  Tobacco Use   Smoking status: Never   Smokeless tobacco: Never  Vaping Use   Vaping Use: Never used  Substance and Sexual Activity   Alcohol use: Yes    Alcohol/week: 2.0 standard drinks of alcohol    Types: 1 Cans of beer, 1 Shots of liquor per week   Drug  use: No   Sexual activity: Yes  Other Topics Concern   Not on file  Social History Narrative   Works as Holiday representative (drives large truck), 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 Sometimes back home at night, sometimes he's gone for a week at a time. Married with 4 children; 1 grandchild.   Social Determinants of Health   Financial Resource Strain: Low Risk  (05/19/2022)   Overall Financial Resource Strain (CARDIA)    Difficulty of Paying Living Expenses: Not hard at all  Food Insecurity: No Food Insecurity (05/19/2022)   Hunger Vital Sign    Worried About Running Out of Food in the Last Year: Never true    Ran Out of Food in the Last Year: Never true  Transportation Needs: No Transportation Needs (05/19/2022)   PRAPARE - Hydrologist (Medical): No    Lack of Transportation (Non-Medical): No  Physical Activity: Sufficiently Active (05/19/2022)   Exercise Vital Sign    Days of Exercise per Week: 7 days    Minutes of Exercise per Session: 30 min  Stress: No Stress Concern Present (05/19/2022)   [place]    Feeling of Stress : Not at all  Social Connections: Moderately Integrated (05/19/2022)   Social Connection and Isolation Panel [NHANES]  Frequency of Communication with Friends and Family: Twice a week    Frequency of Social Gatherings with Friends and Family: Twice a week    Attends Religious Services: More than 4 times per year    Active Member of [employer] or Organizations: No    Attends Archivist Meetings: Never    Marital Status: Married    Family History        Family History  Problem Relation Age of Onset   Dementia Mother    Diabetes Paternal Grandfather     Patient Active Problem List   Diagnosis Date Noted   Elevated LDL cholesterol level 07/10/2019   IFG (impaired fasting glucose) 07/10/2019    Past Surgical History:  Procedure Laterality Date   COLONOSCOPY WITH  PROPOFOL N/A 07/06/2017   Procedure: COLONOSCOPY WITH PROPOFOL;  Surgeon: Lin Landsman, MD;  Location: [place];  Service: Endoscopy;  Laterality: N/A;   HEMORRHOID SURGERY     1990's   PLANTAR FASCIA SURGERY  2007   POLYPECTOMY  07/06/2017   Procedure: POLYPECTOMY;  Surgeon: Lin Landsman, MD;  Location: [place];  Service: Endoscopy;;     Current Outpatient Medications:    meloxicam (MOBIC) 7.5 MG tablet, Take 1 tablet (7.5 mg total) by mouth daily as needed for pain. Okay for tylenol only, no other NSAIDs; take with food (Patient not taking: Reported on 05/19/2022), Disp: 30 tablet, Rfl: 0  No Known Allergies  Patient Care Team: Delsa Grana, PA-C as PCP - General (Family Medicine)   Chart Review: I personally reviewed active problem list, medication list, allergies, family history, social history, health maintenance, notes from last encounter, lab results, imaging with the patient/caregiver today.    Review of Systems  Constitutional: Negative.   HENT: Negative.    Eyes: Negative.   Respiratory: Negative.    Cardiovascular: Negative.   Gastrointestinal: Negative.   Endocrine: Negative.   Genitourinary: Negative.   Musculoskeletal: Negative.   Skin: Negative.   Allergic/Immunologic: Negative.   Neurological: Negative.   Hematological: Negative.   Psychiatric/Behavioral: Negative.    All other systems reviewed and are negative.         Objective:   Vitals:  Vitals:   05/19/22 1447  BP: 124/72  Pulse: 64  Resp: 16  Temp: 98.2 F (36.8 C)  TempSrc: Oral  SpO2: 96%  Weight: 225 lb 1.6 oz (102.1 kg)  Height: 6' (1.829 m)    Body mass index is 30.53 kg/m.  Physical Exam Vitals and nursing note reviewed.  Constitutional:      General: He is not in acute distress.    Appearance: Normal appearance. He is well-developed, well-groomed and overweight. He is not ill-appearing, toxic-appearing or diaphoretic.  HENT:     Head:  Normocephalic and atraumatic.     Jaw: No trismus.     Right Ear: Tympanic membrane, ear canal and external ear normal.     Left Ear: Tympanic membrane, ear canal and external ear normal.     Nose: Nose normal. No mucosal edema, congestion or rhinorrhea.     Right Sinus: No maxillary sinus tenderness or frontal sinus tenderness.     Left Sinus: No maxillary sinus tenderness or frontal sinus tenderness.     Mouth/Throat:     Mouth: Mucous membranes are moist.     Pharynx: Oropharynx is clear. Uvula midline. No oropharyngeal exudate, posterior oropharyngeal erythema or uvula swelling.  Eyes:     General: Lids are normal. No  scleral icterus.       Right eye: No discharge.        Left eye: No discharge.     Conjunctiva/sclera: Conjunctivae normal.     Pupils: Pupils are equal, round, and reactive to light.  Neck:     Trachea: Trachea and phonation normal. No tracheal deviation.  Cardiovascular:     Rate and Rhythm: Normal rate and regular rhythm.     Pulses: Normal pulses.          Radial pulses are 2+ on the right side and 2+ on the left side.       Posterior tibial pulses are 2+ on the right side and 2+ on the left side.     Heart sounds: Normal heart sounds. No murmur heard.    No friction rub. No gallop.  Pulmonary:     Effort: Pulmonary effort is normal. No respiratory distress.     Breath sounds: Normal breath sounds. No stridor. No wheezing, rhonchi or rales.  Abdominal:     General: Bowel sounds are normal. There is no distension.     Palpations: Abdomen is soft.     Tenderness: There is no abdominal tenderness. There is no guarding or rebound.  Musculoskeletal:     Cervical back: Normal range of motion and neck supple.     Right lower leg: No edema.     Left lower leg: No edema.  Skin:    General: Skin is warm and dry.     Capillary Refill: Capillary refill takes less than 2 seconds.     Findings: No rash.  Neurological:     Mental Status: He is alert. Mental status is  at baseline.     Motor: No abnormal muscle tone.     Coordination: Coordination normal.     Gait: Gait normal.  Psychiatric:        Mood and Affect: Mood normal.        Speech: Speech normal.        Behavior: Behavior normal. Behavior is cooperative.      No results found for this or any previous visit (from the past 2160 hour(s)).  Fall Risk:    05/19/2022    2:25 PM 07/10/2019    8:48 AM 06/23/2018    8:51 AM 06/22/2017    9:58 AM 06/13/2015    9:10 AM  Fall Risk   Falls in the past year? 0 0 0 No No  Number falls in past yr: 0 0 0    Injury with Fall? 0 0 0    Risk for fall due to : No Fall Risks      Follow up Falls prevention discussed;Education provided;Falls evaluation completed Falls evaluation completed       Functional Status Survey: Is the patient deaf or have difficulty hearing?: No Does the patient have difficulty seeing, even when wearing glasses/contacts?: No Does the patient have difficulty concentrating, remembering, or making decisions?: No Does the patient have difficulty walking or climbing stairs?: No Does the patient have difficulty dressing or bathing?: No Does the patient have difficulty doing errands alone such as visiting a doctor's office or shopping?: No   Assessment & Plan:    CPE completed today  Prostate cancer screening and PSA options (with potential risks and benefits of testing vs not testing) were discussed along with recent recs/guidelines, shared decision making and handout/information given to pt today  USPSTF grade A and B recommendations reviewed with patient; age-appropriate recommendations, preventive  care, screening tests, etc discussed and encouraged; healthy living encouraged; see AVS for patient education given to patient  Discussed importance of 150 minutes of physical activity weekly, AHA exercise recommendations given to pt in AVS/handout  Discussed importance of healthy diet:  eating lean meats and proteins, avoiding trans  fats and saturated fats, avoid simple sugars and excessive carbs in diet, eat 6 servings of fruit/vegetables daily and drink plenty of water and avoid sweet beverages.  DASH diet reviewed if pt has HTN  Recommended pt to do annual eye exam and routine dental exams/cleanings  Advance Care planning information and packet discussed and offered today, encouraged pt to discuss with family members/spouse/partner/friends and complete Advanced directive packet and bring copy to office   Reviewed Health Maintenance: Health Maintenance  Topic Date Due   COVID-19 Vaccine (3 - 2023-24 season) 06/04/2022 (Originally 02/05/2022)   Zoster Vaccines- Shingrix (2 of 2) 08/18/2022 (Originally 08/18/2018)   INFLUENZA VACCINE  09/05/2022 (Originally 01/05/2022)   COLONOSCOPY (Pts 45-61yr Insurance coverage will need to be confirmed)  07/06/2022   DTaP/Tdap/Td (2 - Td or Tdap) 06/23/2028   Hepatitis C Screening  Completed   HIV Screening  Completed   HPV VACCINES  Aged Out    Immunizations: Immunization History  Administered Date(s) Administered   Influenza, Seasonal, Injecte, Preservative Fre 06/13/2015   Influenza,inj,Quad PF,6+ Mos 06/13/2015   Influenza-Unspecified 06/13/2015   PFIZER(Purple Top)SARS-COV-2 Vaccination 09/15/2019, 10/09/2019   Tdap 06/23/2018   Zoster Recombinat (Shingrix) 06/23/2018   Vaccines:  HPV: up to at age 60, ask insurance if age between 255-45 Shingrix: 547-64yo and ask insurance if covered when patient above 61yo Pneumonia: n/a educated and discussed with patient. Flu: due educated and discussed with patient. COVID:       ICD-10-CM   1. Annual physical exam  ZX72.62COMPLETE METABOLIC PANEL WITH GFR    CBC with Differential/Platelet    Lipid panel    Hemoglobin A1c    2. Need for influenza vaccination  Z23     3. Screening for prostate cancer  Z12.5      HLD - with prior ASCVD risk >7.5% we discussed indications for statins and other diet lifestyle efforts that  he can work on - would like to probably recheck in 6 months is still elevated and ASCVD high (whether diet/lifestyle efforts +/- starting meds)  6 month f/up HLD/obesity     LDelsa Grana PA-C 05/19/22 2:52 PM  [facility]

## 2022-05-19 NOTE — Patient Instructions (Addendum)
Health Maintenance  Topic Date Due   COVID-19 Vaccine (3 - 2023-24 season) 06/04/2022*   Zoster (Shingles) Vaccine (2 of 2) 08/18/2022*   Flu Shot  09/05/2022*   Colon Cancer Screening  07/06/2022   DTaP/Tdap/Td vaccine (2 - Td or Tdap) 06/23/2028   Hepatitis C Screening: USPSTF Recommendation to screen - Ages 3-60 yo.  Completed   HIV Screening  Completed   HPV Vaccine  Aged Out  *Topic was postponed. The date shown is not the original due date.   You should hear from GI in the next few weeks to arrange your follow up colonoscopy testing    Preventive Care 68-31 Years Old, Male Preventive care refers to lifestyle choices and visits with your health care provider that can promote health and wellness. Preventive care visits are also called wellness exams. What can I expect for my preventive care visit? Counseling During your preventive care visit, your health care provider may ask about your: Medical history, including: Past medical problems. Family medical history. Current health, including: Emotional well-being. Home life and relationship well-being. Sexual activity. Lifestyle, including: Alcohol, nicotine or tobacco, and drug use. Access to firearms. Diet, exercise, and sleep habits. Safety issues such as seatbelt and bike helmet use. Sunscreen use. Work and work Statistician. Physical exam Your health care provider will check your: Height and weight. These may be used to calculate your BMI (body mass index). BMI is a measurement that tells if you are at a healthy weight. Waist circumference. This measures the distance around your waistline. This measurement also tells if you are at a healthy weight and may help predict your risk of certain diseases, such as type 2 diabetes and high blood pressure. Heart rate and blood pressure. Body temperature. Skin for abnormal spots. What immunizations do I need?  Vaccines are usually given at various ages, according to a schedule.  Your health care provider will recommend vaccines for you based on your age, medical history, and lifestyle or other factors, such as travel or where you work. What tests do I need? Screening Your health care provider may recommend screening tests for certain conditions. This may include: Lipid and cholesterol levels. Diabetes screening. This is done by checking your blood sugar (glucose) after you have not eaten for a while (fasting). Hepatitis B test. Hepatitis C test. HIV (human immunodeficiency virus) test. STI (sexually transmitted infection) testing, if you are at risk. Lung cancer screening. Prostate cancer screening. Colorectal cancer screening. Talk with your health care provider about your test results, treatment options, and if necessary, the need for more tests. Follow these instructions at home: Eating and drinking  Eat a diet that includes fresh fruits and vegetables, whole grains, lean protein, and low-fat dairy products. Take vitamin and mineral supplements as recommended by your health care provider. Do not drink alcohol if your health care provider tells you not to drink. If you drink alcohol: Limit how much you have to 0-2 drinks a day. Know how much alcohol is in your drink. In the [place] one drink equals one 12 oz bottle of beer (355 mL), one 5 oz glass of wine (148 mL), or one 1 oz glass of hard liquor (44 mL). Lifestyle Brush your teeth every morning and night with fluoride toothpaste. Floss one time each day. Exercise for at least 30 minutes 5 or more days each week. Do not use any products that contain nicotine or tobacco. These products include cigarettes, chewing tobacco, and vaping devices, such as e-cigarettes. If  you need help quitting, ask your health care provider. Do not use drugs. If you are sexually active, practice safe sex. Use a condom or other form of protection to prevent STIs. Take aspirin only as told by your health care provider. Make sure that  you understand how much to take and what form to take. Work with your health care provider to find out whether it is safe and beneficial for you to take aspirin daily. Find healthy ways to manage stress, such as: Meditation, yoga, or listening to music. Journaling. Talking to a trusted person. Spending time with friends and family. Minimize exposure to UV radiation to reduce your risk of skin cancer. Safety Always wear your seat belt while driving or riding in a vehicle. Do not drive: If you have been drinking alcohol. Do not ride with someone who has been drinking. When you are tired or distracted. While texting. If you have been using any mind-altering substances or drugs. Wear a helmet and other protective equipment during sports activities. If you have firearms in your house, make sure you follow all gun safety procedures. What's next? Go to your health care provider once a year for an annual wellness visit. Ask your health care provider how often you should have your eyes and teeth checked. Stay up to date on all vaccines. This information is not intended to replace advice given to you by your health care provider. Make sure you discuss any questions you have with your health care provider. Document Revised: 11/19/2020 Document Reviewed: 11/19/2020 Elsevier Patient Education  [place].

## 2022-05-20 ENCOUNTER — Other Ambulatory Visit: Payer: Self-pay | Admitting: Family Medicine

## 2022-05-20 DIAGNOSIS — E785 Hyperlipidemia, unspecified: Secondary | ICD-10-CM

## 2022-05-20 LAB — COMPLETE METABOLIC PANEL WITH GFR
AG Ratio: 2 (calc) (ref 1.0–2.5)
ALT: 38 U/L (ref 9–46)
AST: 23 U/L (ref 10–35)
Albumin: 4.7 g/dL (ref 3.6–5.1)
Alkaline phosphatase (APISO): 66 U/L (ref 35–144)
BUN: 14 mg/dL (ref 7–25)
CO2: 30 mmol/L (ref 20–32)
Calcium: 10.3 mg/dL (ref 8.6–10.3)
Chloride: 102 mmol/L (ref 98–110)
Creat: 1.15 mg/dL (ref 0.70–1.35)
Globulin: 2.4 g/dL (calc) (ref 1.9–3.7)
Glucose, Bld: 95 mg/dL (ref 65–99)
Potassium: 5.1 mmol/L (ref 3.5–5.3)
Sodium: 139 mmol/L (ref 135–146)
Total Bilirubin: 0.9 mg/dL (ref 0.2–1.2)
Total Protein: 7.1 g/dL (ref 6.1–8.1)
eGFR: 73 mL/min/{1.73_m2} (ref >=60)

## 2022-05-20 LAB — LIPID PANEL
Cholesterol: 255 mg/dL — ABNORMAL HIGH (ref <200)
HDL: 50 mg/dL (ref >=40)
LDL Cholesterol (Calc): 177 mg/dL (calc) — ABNORMAL HIGH
Non-HDL Cholesterol (Calc): 205 mg/dL (calc) — ABNORMAL HIGH (ref <130)
Total CHOL/HDL Ratio: 5.1 (calc) — ABNORMAL HIGH (ref <5.0)
Triglycerides: 139 mg/dL (ref <150)

## 2022-05-20 LAB — CBC WITH DIFFERENTIAL/PLATELET
Absolute Monocytes: 414 cells/uL (ref 200–950)
MCV: 82.8 fL (ref 80.0–100.0)
Neutro Abs: 2323 cells/uL (ref 1500–7800)
Platelets: 294 10*3/uL (ref 140–400)
RBC: 5.28 10*6/uL (ref 4.20–5.80)
RDW: 12.8 % (ref 11.0–15.0)
Total Lymphocyte: 36.3 %

## 2022-05-20 LAB — HEMOGLOBIN A1C
Hgb A1c MFr Bld: 6.6 % of total Hgb — ABNORMAL HIGH (ref <5.7)
Mean Plasma Glucose: 143 mg/dL
eAG (mmol/L): 7.9 mmol/L

## 2022-05-20 MED ORDER — ROSUVASTATIN CALCIUM 5 MG PO TABS: 5.0000 mg | ORAL_TABLET | Freq: Every day | ORAL | 3 refills | Status: DC

## 2023-05-23 ENCOUNTER — Ambulatory Visit (INDEPENDENT_AMBULATORY_CARE_PROVIDER_SITE_OTHER): Payer: 59 | Admitting: Physician Assistant

## 2023-05-23 ENCOUNTER — Telehealth: Payer: Self-pay

## 2023-05-23 VITALS — BP 122/80 | HR 77 | Resp 16 | Ht 72.0 in | Wt 223.0 lb

## 2023-05-23 DIAGNOSIS — Z1329 Encounter for screening for other suspected endocrine disorder: Secondary | ICD-10-CM | POA: Diagnosis not present

## 2023-05-23 DIAGNOSIS — Z131 Encounter for screening for diabetes mellitus: Secondary | ICD-10-CM | POA: Diagnosis not present

## 2023-05-23 DIAGNOSIS — E782 Mixed hyperlipidemia: Secondary | ICD-10-CM

## 2023-05-23 DIAGNOSIS — Z1211 Encounter for screening for malignant neoplasm of colon: Secondary | ICD-10-CM

## 2023-05-23 DIAGNOSIS — Z136 Encounter for screening for cardiovascular disorders: Secondary | ICD-10-CM

## 2023-05-23 DIAGNOSIS — Z125 Encounter for screening for malignant neoplasm of prostate: Secondary | ICD-10-CM

## 2023-05-23 DIAGNOSIS — Z0000 Encounter for general adult medical examination without abnormal findings: Secondary | ICD-10-CM | POA: Diagnosis not present

## 2023-05-23 NOTE — Progress Notes (Signed)
Annual Physical Exam   Name: Daniel Tapia   MRN: [id number]    DOB: 07/17/1960   Date:05/23/2023  Today's Provider: Jacquelin Hawking, MHS, PA-C Introduced myself to the patient as a PA-C and provided education on APPs in clinical practice.         Subjective  Chief Complaint  Chief Complaint  Patient presents with   Annual Exam    HPI  Patient presents for annual CPE .   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 Less than half the time     How often have you had to urinate less than every two hours? Less than half the time     How often have you found you stopped and started again several times when you urinated? Not at All     How often have you found it difficult to postpone urination? Less than half the time     How often have you had a weak urinary stream? Less than half the time     How often have you had to strain to start urination? Not at All     How many times did you typically get up at night to urinate? 1 Time     Total IPSS Score 9                 Depression: phq 9 is negative    05/23/2023    9:04 AM 05/19/2022    2:25 PM 07/10/2019    8:48 AM 06/23/2018    8:51 AM 06/22/2017    9:58 AM  Depression screen PHQ 2/9  Decreased Interest 0 0 0 0 0  Down, Depressed, Hopeless 0 0 0 0 0  PHQ - 2 Score 0 0 0 0 0  Altered sleeping 0 0 0 0   Tired, decreased energy 0 0 0 0   Change in appetite 0 0 0 0   Feeling bad or failure about yourself  0 0 0 0   Trouble concentrating 0 0 0 0   Moving slowly or fidgety/restless 0 0 0 0   Suicidal thoughts 0 0 0 0   PHQ-9 Score 0 0 0 0   Difficult doing  work/chores  Not difficult at all Not difficult at all Not difficult at all     Hypertension:  BP Readings from Last 3 Encounters:  05/23/23 122/80  05/19/22 124/72  07/10/19 124/82    Obesity: Wt Readings from Last 3 Encounters:  05/23/23 223 lb (101.2 kg)  05/19/22 225 lb 1.6 oz (102.1 kg)  07/10/19 219 lb 3.2 oz (99.4 kg)   BMI Readings from Last 3 Encounters:  05/23/23 30.24 kg/m  05/19/22 30.53 kg/m  07/10/19 28.92 kg/m     Lipids:  Lab Results  Component Value Date   CHOL 255 (H) 05/19/2022   CHOL 219 (H) 07/10/2019   CHOL 238 (H) 06/23/2018   Lab Results  Component Value Date   HDL 50 05/19/2022   HDL 53 07/10/2019   HDL  43 06/23/2018   Lab Results  Component Value Date   LDLCALC 177 (H) 05/19/2022   LDLCALC 144 (H) 07/10/2019   LDLCALC 165 (H) 06/23/2018   Lab Results  Component Value Date   TRIG 139 05/19/2022   TRIG 107 07/10/2019   TRIG 153 (H) 06/23/2018   Lab Results  Component Value Date   CHOLHDL 5.1 (H) 05/19/2022   CHOLHDL 4.1 07/10/2019   CHOLHDL 5.5 (H) 06/23/2018   No results found for: "LDLDIRECT" Glucose:  Glucose, Bld  Date Value Ref Range Status  05/19/2022 95 65 - 99 mg/dL Final    Comment:    .            Fasting reference interval .   07/10/2019 115 (H) 65 - 99 mg/dL Final    Comment:    .            Fasting reference interval . For someone without known diabetes, a glucose value between 100 and 125 mg/dL is consistent with prediabetes and should be confirmed with a follow-up test. .   06/23/2018 101 (H) 65 - 99 mg/dL Final    Comment:    .            Fasting reference interval . For someone without known diabetes, a glucose value between 100 and 125 mg/dL is consistent with prediabetes and should be confirmed with a follow-up test. .     Flowsheet Row Office Visit from 05/19/2022 in [facility]  AUDIT-C Score 4        Married STD testing and prevention  (HIV/chl/gon/syphilis):  no , declines screening today  Skin cancer: Discussed monitoring for atypical lesions Colorectal cancer screening: Colonoscopy ordered today  Prostate cancer screening:  ordered Lab Results  Component Value Date   PSA 0.3 07/10/2019   PSA 0.3 06/23/2018   PSA 0.3 06/22/2017     Lung cancer:  Low Dose CT Chest recommended if Age 74-80 years, 30 pack-year currently smoking OR have quit w/in 15years. Patient  not applicable AAA: The USPSTF recommends one-time screening with ultrasonography in men ages 66 to 75 years who have ever smoked. Patient:  not applicable ECG:  NA  Health Maintenance  Topic Date Due   Zoster (Shingles) Vaccine (2 of 2) 08/18/2018   Colon Cancer Screening  07/06/2022   COVID-19 Vaccine (3 - 2024-25 season) 06/08/2023*   Flu Shot  09/05/2023*   DTaP/Tdap/Td vaccine (2 - Td or Tdap) 06/23/2028   Hepatitis C Screening  Completed   HIV Screening  Completed   HPV Vaccine  Aged Out  *Topic was postponed. The date shown is not the original due date.     Advanced Care Planning: A voluntary discussion about advance care planning including the explanation and discussion of advance directives.  Discussed health care proxy and Living will, and the patient was able to identify a health care proxy as his wife, he plans to complete paperwork.  Patient does not have a living will in effect at this time.  Patient Active Problem List   Diagnosis Date Noted   Hyperlipidemia 05/19/2022   Class 1 obesity with body mass index (BMI) of 30.0 to 30.9 in adult 05/19/2022   Elevated LDL cholesterol level 07/10/2019   IFG (impaired fasting glucose) 07/10/2019    Past Surgical History:  Procedure Laterality Date   COLONOSCOPY WITH PROPOFOL N/A 07/06/2017   Procedure: COLONOSCOPY WITH PROPOFOL;  Surgeon: Toney Reil, MD;  Location: [facility];  Service: Endoscopy;  Laterality: N/A;   HEMORRHOID SURGERY     1990's   PLANTAR FASCIA SURGERY   2007   POLYPECTOMY  07/06/2017   Procedure: POLYPECTOMY;  Surgeon: Toney Reil, MD;  Location: [facility];  Service: Endoscopy;;    Family History  Problem Relation Age of Onset   Dementia Mother    Diabetes Paternal Grandfather     Social History   Socioeconomic History   Marital status: Married    Spouse name: carman   Number of children: 4   Years of education: 12   Highest education level: Associate degree: academic program  Occupational History   Occupation: [employer]  Tobacco Use   Smoking status: Never   Smokeless tobacco: Never  Vaping Use   Vaping status: Never Used  Substance and Sexual Activity   Alcohol use: Yes    Alcohol/week: 2.0 standard drinks of alcohol    Types: 1 Cans of beer, 1 Shots of liquor per week   Drug use: No   Sexual activity: Yes  Other Topics Concern   Not on file  Social History Narrative   Works as Chief Strategy Officer (drives large truck), 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 Sometimes back home at night, sometimes he's gone for a week at a time. Married with 4 children; 1 grandchild.   One child still at home 52   Social Drivers of Health   Financial Resource Strain: Low Risk  (05/23/2023)   Overall Financial Resource Strain (CARDIA)    Difficulty of Paying Living Expenses: Not hard at all  Food Insecurity: No Food Insecurity (05/23/2023)   Hunger Vital Sign    Worried About Running Out of Food in the Last Year: Never true    Ran Out of Food in the Last Year: Never true  Transportation Needs: No Transportation Needs (05/23/2023)   PRAPARE - Administrator, Civil Service (Medical): No    Lack of Transportation (Non-Medical): No  Physical Activity: Sufficiently Active (05/23/2023)   Exercise Vital Sign    Days of Exercise per Week: 7 days    Minutes of Exercise per Session: 30 min  Stress: No Stress Concern Present (05/23/2023)   [employer] of Occupational Health - Occupational Stress Questionnaire     Feeling of Stress : Not at all  Social Connections: Moderately Integrated (05/19/2022)   Social Connection and Isolation Panel [NHANES]    Frequency of Communication with Friends and Family: Twice a week    Frequency of Social Gatherings with Friends and Family: Twice a week    Attends Religious Services: More than 4 times per year    Active Member of [employer] or Organizations: No    Attends Banker Meetings: Never    Marital Status: Married  Catering manager Violence: Not At Risk (05/23/2023)   Humiliation, Afraid, Rape, and Kick questionnaire    Fear of Current or Ex-Partner: No    Emotionally Abused: No    Physically Abused: No    Sexually Abused: No    No current outpatient medications on file.  No Known Allergies   Review of Systems  Constitutional:  Negative for chills, fever, malaise/fatigue and weight loss.  HENT:  Negative for hearing loss, nosebleeds, sore throat and tinnitus.   Eyes:  Negative for blurred vision, double vision and photophobia.  Respiratory:  Negative for cough, shortness of breath and wheezing.   Cardiovascular:  Negative for chest pain, palpitations and leg swelling.  Gastrointestinal:  Negative for blood in stool, constipation, diarrhea, heartburn, nausea and vomiting.  Genitourinary:  Negative for dysuria and frequency.  Musculoskeletal:  Negative for falls, joint pain and myalgias.  Skin:  Negative for itching and rash.  Neurological:  Negative for dizziness, tingling, tremors, loss of consciousness, weakness and headaches.  Psychiatric/Behavioral:  Negative for depression and memory loss. The patient is not nervous/anxious and does not have insomnia.        Objective  Vitals:   05/23/23 0905  BP: 122/80  Pulse: 77  Resp: 16  SpO2: 97%  Weight: 223 lb (101.2 kg)  Height: 6' (1.829 m)    Body mass index is 30.24 kg/m.  Physical Exam Vitals reviewed.  Constitutional:      General: He is awake.     Appearance: Normal  appearance. He is well-developed and well-groomed.  HENT:     Head: Normocephalic and atraumatic.     Right Ear: Hearing and ear canal normal. A middle ear effusion is present.     Left Ear: Hearing and ear canal normal. A middle ear effusion is present.     Nose: Nose normal.     Mouth/Throat:     Lips: Pink.     Mouth: Mucous membranes are moist. No lacerations or oral lesions.     Pharynx: Oropharynx is clear. Uvula midline. No pharyngeal swelling, oropharyngeal exudate or posterior oropharyngeal erythema.  Eyes:     General: Lids are normal. Gaze aligned appropriately.     Extraocular Movements: Extraocular movements intact.     Right eye: Normal extraocular motion and no nystagmus.     Left eye: Normal extraocular motion and no nystagmus.     Conjunctiva/sclera: Conjunctivae normal.     Pupils: Pupils are equal, round, and reactive to light.  Neck:     Thyroid: No thyroid mass, thyromegaly or thyroid tenderness.     Trachea: Phonation normal.  Cardiovascular:     Rate and Rhythm: Normal rate and regular rhythm.     Pulses: Normal pulses.          Radial pulses are 2+ on the right side and 2+ on the left side.     Heart sounds: Normal heart sounds. No murmur heard.    No friction rub. No gallop.  Pulmonary:     Effort: Pulmonary effort is normal.     Breath sounds: Normal breath sounds. No decreased air movement. No decreased breath sounds, wheezing, rhonchi or rales.  Abdominal:     General: Abdomen is flat. Bowel sounds are normal.     Palpations: Abdomen is soft.     Tenderness: There is no abdominal tenderness.  Musculoskeletal:     Cervical back: Normal range of motion and neck supple.     Right lower leg: No edema.     Left lower leg: No edema.  Lymphadenopathy:     Head:     Right side of head: No submental, submandibular or preauricular adenopathy.     Left side of head: No submental, submandibular or preauricular adenopathy.     Cervical: No cervical adenopathy.      Right cervical: No superficial or posterior cervical adenopathy.    Left cervical: No superficial or posterior cervical adenopathy.     Upper Body:     Right upper body: No supraclavicular adenopathy.     Left upper body: No supraclavicular adenopathy.  Skin:    General: Skin is warm and dry.  Neurological:     General: No  focal deficit present.     Mental Status: He is alert and oriented to person, place, and time. Mental status is at baseline.     GCS: GCS eye subscore is 4. GCS verbal subscore is 5. GCS motor subscore is 6.     Cranial Nerves: No cranial nerve deficit, dysarthria or facial asymmetry.     Motor: No weakness, tremor, atrophy or abnormal muscle tone.     Gait: Gait is intact.     Deep Tendon Reflexes:     Reflex Scores:      Patellar reflexes are 0 on the right side and 0 on the left side. Psychiatric:        Attention and Perception: Attention and perception normal.        Mood and Affect: Mood and affect normal.        Speech: Speech normal.        Behavior: Behavior normal. Behavior is cooperative.        Thought Content: Thought content normal.        Cognition and Memory: Cognition normal.        Judgment: Judgment normal.      No results found for this or any previous visit (from the past 2160 hours).   Fall Risk:    05/23/2023    9:04 AM 05/19/2022    2:25 PM 07/10/2019    8:48 AM 06/23/2018    8:51 AM 06/22/2017    9:58 AM  Fall Risk   Falls in the past year? 0 0 0 0 No  Number falls in past yr: 0 0 0 0   Injury with Fall? 0 0 0 0   Risk for fall due to : No Fall Risks No Fall Risks     Follow up Falls prevention discussed Falls prevention discussed;Education provided;Falls evaluation completed Falls evaluation completed       Functional Status Survey:      Assessment & Plan  Problem List Items Addressed This Visit   None Visit Diagnoses       Well adult exam    -  Primary   Relevant Orders   TSH   Hemoglobin A1c   Lipid panel    CBC with Differential/Platelet   COMPLETE METABOLIC PANEL WITH GFR   PSA     Screening for diabetes mellitus (DM)       Relevant Orders   Hemoglobin A1c     Screening for thyroid disorder       Relevant Orders   TSH     Screening for prostate cancer       Relevant Orders   PSA     Screening for ischemic heart disease       Relevant Orders   Lipid panel     Screening for colon cancer       Relevant Orders   Ambulatory referral to Gastroenterology       -Prostate cancer screening and PSA options (with potential risks and benefits of testing vs not testing) were discussed along with recent recs/guidelines. -USPSTF grade A and B recommendations reviewed with patient; age-appropriate recommendations, preventive care, screening tests, etc discussed and encouraged; healthy living encouraged; see AVS for patient education given to patient -Discussed importance of 150 minutes of physical activity weekly, eat two servings of fish weekly, eat one serving of tree nuts ( cashews, pistachios, pecans, almonds.Marland Kitchen) every other day, eat 6 servings of fruit/vegetables daily and drink plenty of water and avoid sweet beverages.  -Reviewed  Health Maintenance: yes  Return in about 1 year (around 05/22/2024) for Annual Physical.   I, Tomeca Helm E Brixon Zhen, PA-C, have reviewed all documentation for this visit. The documentation on 05/23/23 for the exam, diagnosis, procedures, and orders are all accurate and complete.   Jacquelin Hawking, MHS, PA-C [facility]

## 2023-05-23 NOTE — Telephone Encounter (Signed)
Colonoscopy is not due until June 2025 with Dr. Allegra Lai.  Reminder created to call patient back in May 2025 to schedule.  Thanks,  [place], [place]

## 2023-05-24 LAB — COMPLETE METABOLIC PANEL WITH GFR
AG Ratio: 1.8 (calc) (ref 1.0–2.5)
ALT: 45 U/L (ref 9–46)
AST: 30 U/L (ref 10–35)
Albumin: 4.4 g/dL (ref 3.6–5.1)
Alkaline phosphatase (APISO): 65 U/L (ref 35–144)
BUN: 16 mg/dL (ref 7–25)
CO2: 30 mmol/L (ref 20–32)
Calcium: 9.6 mg/dL (ref 8.6–10.3)
Chloride: 102 mmol/L (ref 98–110)
Creat: 1.03 mg/dL (ref 0.70–1.35)
Globulin: 2.4 g/dL (ref 1.9–3.7)
Glucose, Bld: 121 mg/dL — ABNORMAL HIGH (ref 65–99)
Potassium: 5 mmol/L (ref 3.5–5.3)
Sodium: 139 mmol/L (ref 135–146)
Total Bilirubin: 0.7 mg/dL (ref 0.2–1.2)
Total Protein: 6.8 g/dL (ref 6.1–8.1)
eGFR: 83 mL/min/{1.73_m2} (ref >=60)

## 2023-05-24 LAB — TSH: TSH: 1.21 m[IU]/L (ref 0.40–4.50)

## 2023-05-24 LAB — CBC WITH DIFFERENTIAL/PLATELET
Absolute Lymphocytes: 1484 {cells}/uL (ref 850–3900)
Absolute Monocytes: 426 {cells}/uL (ref 200–950)
Basophils Absolute: 29 {cells}/uL (ref 0–200)
Basophils Relative: 0.7 %
Eosinophils Absolute: 131 {cells}/uL (ref 15–500)
Eosinophils Relative: 3.2 %
HCT: 45.7 % (ref 38.5–50.0)
Hemoglobin: 14.6 g/dL (ref 13.2–17.1)
MCH: 26.8 pg — ABNORMAL LOW (ref 27.0–33.0)
MCHC: 31.9 g/dL — ABNORMAL LOW (ref 32.0–36.0)
MCV: 84 fL (ref 80.0–100.0)
MPV: 10.1 fL (ref 7.5–12.5)
Monocytes Relative: 10.4 %
Neutro Abs: 2030 {cells}/uL (ref 1500–7800)
Neutrophils Relative %: 49.5 %
Platelets: 313 10*3/uL (ref 140–400)
RBC: 5.44 10*6/uL (ref 4.20–5.80)
RDW: 12.6 % (ref 11.0–15.0)
Total Lymphocyte: 36.2 %
WBC: 4.1 10*3/uL (ref 3.8–10.8)

## 2023-05-24 LAB — LIPID PANEL
Cholesterol: 226 mg/dL — ABNORMAL HIGH (ref <200)
HDL: 49 mg/dL (ref >=40)
LDL Cholesterol (Calc): 142 mg/dL — ABNORMAL HIGH
Non-HDL Cholesterol (Calc): 177 mg/dL — ABNORMAL HIGH (ref <130)
Total CHOL/HDL Ratio: 4.6 (calc) (ref <5.0)
Triglycerides: 201 mg/dL — ABNORMAL HIGH (ref <150)

## 2023-05-24 LAB — PSA: PSA: 1.46 ng/mL (ref <=4.00)

## 2023-05-24 LAB — HEMOGLOBIN A1C
Hgb A1c MFr Bld: 6.6 %{Hb} — ABNORMAL HIGH (ref <5.7)
Mean Plasma Glucose: 143 mg/dL
eAG (mmol/L): 7.9 mmol/L

## 2023-05-27 MED ORDER — ROSUVASTATIN CALCIUM 10 MG PO TABS: 10.0000 mg | ORAL_TABLET | Freq: Every day | ORAL | 1 refills | Status: AC

## 2023-05-27 NOTE — Progress Notes (Signed)
Your labs are back Your A1c was 6.6% which is in diabetic range. At this time it is still less than 7.0 which is in goal so no changes are recommended at this time  Your cholesterol is still elevated. Per our discussion during your apt I have sent in a cholesterol medication for you to start to help manage this. Please reduce your saturated fat intake and try to incorporate exercise into your weekly regimen Your CBC was overall normal- no signs of anemia Your electrolytes, liver and kidney function were overall normal at this time Your thyroid and prostate testing were normal  Please let us know if you have further questions or concerns

## 2023-05-27 NOTE — Addendum Note (Signed)
Addended by: Jacquelin Hawking on: 05/27/2023 03:18 PM   Modules accepted: Orders

## 2023-10-10 NOTE — Telephone Encounter (Signed)
 Pt has been contacted to see if he would like to schedule his colonoscopy now with Dr. Baldomero Bone.  He stated now is not a good time because he has just started a new job.  Requested a reminder call in August.  I informed him that I will send him a reminder letter and to hold on to it as his reminder.  Thanks, [place], [place]

## 2023-12-09 ENCOUNTER — Other Ambulatory Visit: Payer: Self-pay

## 2023-12-09 ENCOUNTER — Emergency Department

## 2023-12-09 DIAGNOSIS — W19XXXA Unspecified fall, initial encounter: Secondary | ICD-10-CM | POA: Insufficient documentation

## 2023-12-09 DIAGNOSIS — Y9368 Activity, volleyball (beach) (court): Secondary | ICD-10-CM | POA: Insufficient documentation

## 2023-12-09 DIAGNOSIS — S76112A Strain of left quadriceps muscle, fascia and tendon, initial encounter: Secondary | ICD-10-CM | POA: Diagnosis not present

## 2023-12-09 DIAGNOSIS — M25562 Pain in left knee: Secondary | ICD-10-CM | POA: Diagnosis present

## 2023-12-09 LAB — COMPREHENSIVE METABOLIC PANEL WITH GFR
ALT: 27 U/L (ref 0–44)
AST: 28 U/L (ref 15–41)
Albumin: 3.7 g/dL (ref 3.5–5.0)
Alkaline Phosphatase: 47 U/L (ref 38–126)
Anion gap: 9 (ref 5–15)
BUN: 16 mg/dL (ref 8–23)
CO2: 26 mmol/L (ref 22–32)
Calcium: 8.9 mg/dL (ref 8.9–10.3)
Chloride: 105 mmol/L (ref 98–111)
Creatinine, Ser: 1.33 mg/dL — ABNORMAL HIGH (ref 0.61–1.24)
GFR, Estimated: >60 mL/min (ref >60)
Glucose, Bld: 103 mg/dL — ABNORMAL HIGH (ref 70–99)
Potassium: 4.7 mmol/L (ref 3.5–5.1)
Sodium: 140 mmol/L (ref 135–145)
Total Bilirubin: 1.1 mg/dL (ref 0.0–1.2)
Total Protein: 6.1 g/dL — ABNORMAL LOW (ref 6.5–8.1)

## 2023-12-09 LAB — CBC WITH DIFFERENTIAL/PLATELET
Abs Immature Granulocytes: 0.02 K/uL (ref 0.00–0.07)
Basophils Absolute: 0 K/uL (ref 0.0–0.1)
Basophils Relative: 1 %
Eosinophils Absolute: 0.1 K/uL (ref 0.0–0.5)
Eosinophils Relative: 2 %
HCT: 41.7 % (ref 39.0–52.0)
Hemoglobin: 13.3 g/dL (ref 13.0–17.0)
Immature Granulocytes: 0 %
Lymphocytes Relative: 40 %
Lymphs Abs: 1.9 K/uL (ref 0.7–4.0)
MCH: 27.1 pg (ref 26.0–34.0)
MCHC: 31.9 g/dL (ref 30.0–36.0)
MCV: 85.1 fL (ref 80.0–100.0)
Monocytes Absolute: 0.4 K/uL (ref 0.1–1.0)
Monocytes Relative: 9 %
Neutro Abs: 2.2 K/uL (ref 1.7–7.7)
Neutrophils Relative %: 48 %
Platelets: 239 K/uL (ref 150–400)
RBC: 4.9 MIL/uL (ref 4.22–5.81)
RDW: 13.3 % (ref 11.5–15.5)
WBC: 4.6 K/uL (ref 4.0–10.5)
nRBC: 0 % (ref 0.0–0.2)

## 2023-12-09 NOTE — ED Triage Notes (Signed)
 Pt was playing volleyball and came down on his L knee. Pt unable to extend knee independently. Quadriceps tendon unable to be palpated and there is deformity at the area of the quad tendon. Patella in anatomical alignment.

## 2023-12-10 ENCOUNTER — Emergency Department
Admission: EM | Admit: 2023-12-10 | Discharge: 2023-12-10 | Disposition: A | Discharge status: Home / Self Care | Attending: Emergency Medicine | Admitting: Emergency Medicine

## 2023-12-10 DIAGNOSIS — M25562 Pain in left knee: Secondary | ICD-10-CM

## 2023-12-10 DIAGNOSIS — S76112A Strain of left quadriceps muscle, fascia and tendon, initial encounter: Secondary | ICD-10-CM

## 2023-12-10 MED ORDER — OXYCODONE HCL 5 MG PO TABS
5.0000 mg | ORAL_TABLET | Freq: Once | ORAL | Status: DC
  Filled 2023-12-10: qty 1

## 2023-12-10 MED ORDER — ACETAMINOPHEN 500 MG PO TABS
1000.0000 mg | ORAL_TABLET | Freq: Once | ORAL | Status: AC
  Administered 2023-12-10: 1000 mg via ORAL
  Filled 2023-12-10: qty 2

## 2023-12-10 MED ORDER — OXYCODONE HCL 5 MG PO CAPS: 5.0000 mg | ORAL_CAPSULE | Freq: Four times a day (QID) | ORAL | 0 refills | Status: DC | PRN

## 2023-12-10 NOTE — ED Provider Notes (Signed)
 [facility] Provider Note    Event Date/Time   First MD Initiated Contact with Patient 12/10/23 [phone]     (approximate)   History   Knee Injury   HPI  DAYTEN JUBA is a 63 y.o. male with history of hyperlipidemia presenting with left knee pain.  Patient playing volleyball, fell onto his left knee, unable to extend the knee independently.  Has a palpable deformity to his quad tendon.  He denies any head strike, not on any blood thinners.  He denies any numbness.  Is just unable to extend at this knee but otherwise strength is intact.  He denies any pain anywhere else.  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   Physical Exam   Triage Vital Signs: ED Triage Vitals  Encounter Vitals Group     BP 12/09/23 2138 113/67     Girls Systolic BP Percentile --      Girls Diastolic BP Percentile --      Boys Systolic BP Percentile --      Boys Diastolic BP Percentile --      Pulse Rate 12/09/23 2138 79     Resp 12/09/23 2138 17     Temp 12/09/23 2138 98.4 F (36.9 C)     Temp Source 12/09/23 2138 Oral     SpO2 12/09/23 2138 95 %     Weight --      Height --      Head Circumference --      Peak Flow --      Pain Score 12/09/23 2136 5     Pain Loc --      Pain Education --      Exclude from Growth Chart --     Most recent vital signs: Vitals:   12/09/23 2138  BP: 113/67  Pulse: 79  Resp: 17  Temp: 98.4 F (36.9 C)  SpO2: 95%     General: Awake, no distress.  CV:  Good peripheral perfusion.  Resp:  Normal effort.  Abd:  No distention.  Other:  Palpable DP pulses bilaterally, no right lower extremity tenderness to palpation, he has no bony tenderness to his left lower extremity but does have a defect to the left quad tendon, unable to actively extend his left knee but no  numbness, equal dorsi and plantarflexion bilaterally.   ED Results / Procedures / Treatments   Labs (all labs ordered are listed, but only abnormal results are displayed) Labs Reviewed  COMPREHENSIVE METABOLIC PANEL WITH GFR - Abnormal; Notable for the following components:      Result Value   Glucose, Bld 103 (*)    Creatinine, Ser 1.33 (*)    Total Protein 6.1 (*)    All other components within normal limits  CBC WITH DIFFERENTIAL/PLATELET     RADIOLOGY On my independent interpretation, x-ray without obvious fracture   PROCEDURES:  Critical Care performed: No  Procedures   MEDICATIONS ORDERED IN ED: Medications  oxyCODONE  (Oxy IR/ROXICODONE ) immediate release tablet 5 mg (has no administration in time range)  acetaminophen  (TYLENOL ) tablet 1,000 mg (has no administration in time range)     IMPRESSION / MDM / ASSESSMENT AND PLAN / ED COURSE  I reviewed the triage vital signs and the nursing notes.  Differential diagnosis includes, but is not limited to, fracture, quad tendon rupture, tear, sprain, strain.  X-ray was obtained out of triage.   Patient's presentation is most consistent with acute presentation with potential threat to life or bodily function.  Independent interpretation of imaging below.  X-ray without fracture but did show some fat density that could be secondary to quad tendon injury.  Orthopedic surgery was consulted, recommended MRI.  If patient is able to go home, recommended knee immobilizer, weightbearing as tolerated, his office will call on Monday to schedule surgery and follow-up.  On reassessment patient states that his pain is controlled so long as he is not moving it, able to shuffle around, we will give him a knee immobilizer and crutches here.  Discussed with him about outpatient follow-up with Ortho.  Will give him a number to call in case he does not hear back from them on Monday.  Will give him a short  prescription for oxycodone  for severe breakthrough pain.  Discussed taking Tylenol  ibuprofen as needed and reserving the oxycodone  for when the Tylenol  ibuprofen are not working.  Considered but no indication for inpatient admission at this time, he safe for outpatient management.  Will discharge with strict return precautions.  Shared decision making up with patient and wife and they are agreeable with this plan.  Discharge.    Clinical Course as of 12/10/23 0116  Sat Dec 10, 2023  0056 DG Knee 2 Views Left IMPRESSION: 1. No acute osseous abnormality. Small knee effusion. 2. Fat density between the expected location of quadriceps tendon and the superior patellar attachment which appears different from the prior radiograph. This may be due to quadriceps tendon injury given clinical history, there is no definitive fracture. MRI evaluation would better assess this finding   [TT]  0056 MR KNEE LEFT WO CONTRAST IMPRESSION: There is a complete tear to the distal quadriceps tendon slightly retracted and best seen on the sagittal sequences.  The menisci and ligaments are unremarkable.   [TT]    Clinical Course User Index [TT] Waymond Lorelle Cummins, MD     FINAL CLINICAL IMPRESSION(S) / ED DIAGNOSES   Final diagnoses:  Acute pain of left knee  Rupture of left quadriceps tendon, initial encounter     Rx / DC Orders   ED Discharge Orders          Ordered    oxycodone  (OXY-IR) 5 MG capsule  Every 6 hours PRN        12/10/23 0114             Note:  This document was prepared using Dragon voice recognition software and may include unintentional dictation errors.    Waymond Lorelle Cummins, MD 12/10/23 [phone]

## 2023-12-10 NOTE — Discharge Instructions (Signed)
 I have given you the number for orthopedic surgery above to follow-up.  Dr. Tobie said that his office will call on Monday to schedule follow-up as well as discuss surgery, if you do not hear back on Monday or Tuesday, please call his office to find out when he will follow-up with you.  You can weight-bear as tolerated, use the knee immobilizer and crutches for comfort.  You can take Tylenol  or ibuprofen every 6 hours as needed for pain, please reserve the oxycodone  for severe breakthrough pain, please do not drive or operate heavy machinery when you are on the oxycodone .

## 2023-12-12 ENCOUNTER — Other Ambulatory Visit: Payer: Self-pay | Admitting: Orthopedic Surgery

## 2023-12-13 ENCOUNTER — Ambulatory Visit: Admitting: Certified Registered"

## 2023-12-13 ENCOUNTER — Ambulatory Visit
Admission: RE | Admit: 2023-12-13 | Discharge: 2023-12-13 | Disposition: A | Discharge status: Home / Self Care | Attending: Orthopedic Surgery | Admitting: Orthopedic Surgery

## 2023-12-13 ENCOUNTER — Encounter
Admission: RE | Disposition: A | Discharge status: Home / Self Care | Payer: Self-pay | Source: Home / Self Care | Attending: Orthopedic Surgery

## 2023-12-13 ENCOUNTER — Other Ambulatory Visit: Payer: Self-pay

## 2023-12-13 ENCOUNTER — Encounter: Payer: Self-pay | Admitting: Orthopedic Surgery

## 2023-12-13 DIAGNOSIS — X509XXA Other and unspecified overexertion or strenuous movements or postures, initial encounter: Secondary | ICD-10-CM | POA: Diagnosis not present

## 2023-12-13 DIAGNOSIS — S76112A Strain of left quadriceps muscle, fascia and tendon, initial encounter: Secondary | ICD-10-CM | POA: Diagnosis present

## 2023-12-13 DIAGNOSIS — R7301 Impaired fasting glucose: Secondary | ICD-10-CM

## 2023-12-13 HISTORY — PX: QUADRICEPS TENDON REPAIR: SHX756

## 2023-12-13 SURGERY — REPAIR, TENDON, QUADRICEPS
Anesthesia: General | Site: Knee | Laterality: Left

## 2023-12-13 MED ORDER — ORAL CARE MOUTH RINSE: 15.0000 mL | Freq: Once | OROMUCOSAL | Status: AC

## 2023-12-13 MED ORDER — OXYCODONE HCL 5 MG PO TABS: 5.0000 mg | ORAL_TABLET | ORAL | 0 refills | Status: AC | PRN

## 2023-12-13 MED ORDER — FENTANYL CITRATE (PF) 100 MCG/2ML IJ SOLN: 25.0000 ug | INTRAMUSCULAR | Status: DC | PRN

## 2023-12-13 MED ORDER — OXYCODONE HCL 5 MG/5ML PO SOLN: 5.0000 mg | Freq: Once | ORAL | Status: DC | PRN

## 2023-12-13 MED ORDER — FENTANYL CITRATE (PF) 100 MCG/2ML IJ SOLN
INTRAMUSCULAR | Status: DC | PRN
  Administered 2023-12-13: 50 ug via INTRAVENOUS
  Administered 2023-12-13 (×2): 25 ug via INTRAVENOUS

## 2023-12-13 MED ORDER — CEFAZOLIN SODIUM-DEXTROSE 2-4 GM/100ML-% IV SOLN
2.0000 g | INTRAVENOUS | Status: AC
  Administered 2023-12-13: 2 g via INTRAVENOUS

## 2023-12-13 MED ORDER — BUPIVACAINE LIPOSOME 1.3 % IJ SUSP
INTRAMUSCULAR | Status: AC
Start: 2023-12-13 — End: 2023-12-13
  Filled 2023-12-13: qty 20

## 2023-12-13 MED ORDER — CHLORHEXIDINE GLUCONATE 0.12 % MT SOLN
15.0000 mL | Freq: Once | OROMUCOSAL | Status: AC
  Administered 2023-12-13: 15 mL via OROMUCOSAL

## 2023-12-13 MED ORDER — ONDANSETRON 4 MG PO TBDP: 4.0000 mg | ORAL_TABLET | Freq: Three times a day (TID) | ORAL | 0 refills | Status: AC | PRN

## 2023-12-13 MED ORDER — FENTANYL CITRATE (PF) 100 MCG/2ML IJ SOLN
INTRAMUSCULAR | Status: AC
  Filled 2023-12-13: qty 2

## 2023-12-13 MED ORDER — MIDAZOLAM HCL 2 MG/2ML IJ SOLN
INTRAMUSCULAR | Status: AC
  Filled 2023-12-13: qty 2

## 2023-12-13 MED ORDER — ASPIRIN 325 MG PO TBEC: 325.0000 mg | DELAYED_RELEASE_TABLET | Freq: Every day | ORAL | 0 refills | Status: AC

## 2023-12-13 MED ORDER — DEXAMETHASONE SODIUM PHOSPHATE 10 MG/ML IJ SOLN
INTRAMUSCULAR | Status: DC | PRN
  Administered 2023-12-13: 5 mg via INTRAVENOUS

## 2023-12-13 MED ORDER — PROPOFOL 10 MG/ML IV BOLUS
INTRAVENOUS | Status: DC | PRN
  Administered 2023-12-13: 200 mg via INTRAVENOUS

## 2023-12-13 MED ORDER — PROPOFOL 1000 MG/100ML IV EMUL
INTRAVENOUS | Status: AC
  Filled 2023-12-13: qty 100

## 2023-12-13 MED ORDER — CEFAZOLIN SODIUM-DEXTROSE 2-4 GM/100ML-% IV SOLN
INTRAVENOUS | Status: AC
  Filled 2023-12-13: qty 100

## 2023-12-13 MED ORDER — LIDOCAINE-EPINEPHRINE (PF) 1 %-1:200000 IJ SOLN
INTRAMUSCULAR | Status: AC
  Filled 2023-12-13: qty 30

## 2023-12-13 MED ORDER — SEVOFLURANE IN SOLN
RESPIRATORY_TRACT | Status: AC
  Filled 2023-12-13: qty 250

## 2023-12-13 MED ORDER — 0.9 % SODIUM CHLORIDE (POUR BTL) OPTIME
TOPICAL | Status: DC | PRN
  Administered 2023-12-13: 500 mL

## 2023-12-13 MED ORDER — CHLORHEXIDINE GLUCONATE 0.12 % MT SOLN
OROMUCOSAL | Status: AC
  Filled 2023-12-13: qty 15

## 2023-12-13 MED ORDER — BUPIVACAINE HCL (PF) 0.5 % IJ SOLN
INTRAMUSCULAR | Status: AC
  Filled 2023-12-13: qty 30

## 2023-12-13 MED ORDER — ACETAMINOPHEN 500 MG PO TABS
1000.0000 mg | ORAL_TABLET | Freq: Three times a day (TID) | ORAL | 2 refills | Status: AC
Start: 2023-12-13 — End: 2024-12-12

## 2023-12-13 MED ORDER — LACTATED RINGERS IV SOLN: INTRAVENOUS | Status: DC

## 2023-12-13 MED ORDER — LIDOCAINE HCL (CARDIAC) PF 100 MG/5ML IV SOSY
PREFILLED_SYRINGE | INTRAVENOUS | Status: DC | PRN
  Administered 2023-12-13: 100 mg via INTRAVENOUS

## 2023-12-13 MED ORDER — OXYCODONE HCL 5 MG PO TABS: 5.0000 mg | ORAL_TABLET | Freq: Once | ORAL | Status: DC | PRN

## 2023-12-13 MED ORDER — BUPIVACAINE LIPOSOME 1.3 % IJ SUSP
INTRAMUSCULAR | Status: DC | PRN
  Administered 2023-12-13: 12 mL via INTRAMUSCULAR

## 2023-12-13 MED ORDER — MIDAZOLAM HCL 2 MG/2ML IJ SOLN
INTRAMUSCULAR | Status: DC | PRN
  Administered 2023-12-13: .5 mg via INTRAVENOUS
  Administered 2023-12-13: 1.5 mg via INTRAVENOUS

## 2023-12-13 MED ORDER — ONDANSETRON HCL 4 MG/2ML IJ SOLN
INTRAMUSCULAR | Status: DC | PRN
Start: 2023-12-13 — End: 2023-12-13
  Administered 2023-12-13: 4 mg via INTRAVENOUS

## 2023-12-13 MED ORDER — EPHEDRINE SULFATE-NACL 50-0.9 MG/10ML-% IV SOSY
PREFILLED_SYRINGE | INTRAVENOUS | Status: DC | PRN
Start: 2023-12-13 — End: 2023-12-13
  Administered 2023-12-13: 10 mg via INTRAVENOUS

## 2023-12-13 SURGICAL SUPPLY — 43 items
BNDG COHESIVE 4X5 TAN STRL LF (GAUZE/BANDAGES/DRESSINGS) ×1 IMPLANT
BNDG ESMARCH 6X12 STRL LF (GAUZE/BANDAGES/DRESSINGS) ×1 IMPLANT
CHLORAPREP W/TINT 26 (MISCELLANEOUS) ×1 IMPLANT
COOLER ICEMAN CLASSIC (MISCELLANEOUS) ×1 IMPLANT
CUFF TRNQT CYL 30X4X21-28X (TOURNIQUET CUFF) IMPLANT
DERMABOND ADVANCED .7 DNX12 (GAUZE/BANDAGES/DRESSINGS) IMPLANT
DRAPE INCISE IOBAN 66X45 STRL (DRAPES) ×1 IMPLANT
DRAPE SHEET LG 3/4 BI-LAMINATE (DRAPES) ×1 IMPLANT
ELECT CAUTERY BLADE 6.4 (BLADE) ×1 IMPLANT
ELECTRODE REM PT RTRN 9FT ADLT (ELECTROSURGICAL) ×1 IMPLANT
GAUZE SPONGE 4X4 12PLY STRL (GAUZE/BANDAGES/DRESSINGS) ×1 IMPLANT
GAUZE XEROFORM 1X8 LF (GAUZE/BANDAGES/DRESSINGS) ×1 IMPLANT
GLOVE BIOGEL PI IND STRL 8 (GLOVE) ×1 IMPLANT
GLOVE SURG SYN 7.5 E (GLOVE) ×2 IMPLANT
GLOVE SURG SYN 7.5 PF PI (GLOVE) ×1 IMPLANT
GOWN SRG LRG LVL 4 IMPRV REINF (GOWNS) ×1 IMPLANT
GOWN SRG XL LONG LVL 3 NONREIN (GOWNS) ×1 IMPLANT
HANDLE YANKAUER SUCT BULB TIP (MISCELLANEOUS) ×1 IMPLANT
KIT TURNOVER KIT A (KITS) ×1 IMPLANT
MANIFOLD NEPTUNE II (INSTRUMENTS) ×1 IMPLANT
NDL MAYO CATGUT SZ1 (NEEDLE) ×1 IMPLANT
NDL REVERSE CUT 1/2 CRC (NEEDLE) ×1 IMPLANT
NEEDLE MAYO CATGUT SZ1 (NEEDLE) ×1 IMPLANT
NEEDLE REVERSE CUT 1/2 CRC (NEEDLE) IMPLANT
NS IRRIG 500ML POUR BTL (IV SOLUTION) IMPLANT
PACK EXTREMITY ARMC (MISCELLANEOUS) ×1 IMPLANT
PAD CAST 4YDX4 CTTN HI CHSV (CAST SUPPLIES) ×1 IMPLANT
PAD COLD UNI WRAP-ON (PAD) ×1 IMPLANT
PENCIL SMOKE EVACUATOR (MISCELLANEOUS) ×1 IMPLANT
RETRIEVER SUT HEWSON (MISCELLANEOUS) ×1 IMPLANT
SPONGE T-LAP 18X18 ~~LOC~~+RFID (SPONGE) ×1 IMPLANT
STAPLER SKIN PROX 35W (STAPLE) ×1 IMPLANT
STOCKINETTE BIAS CUT 6 980064 (GAUZE/BANDAGES/DRESSINGS) ×1 IMPLANT
STOCKINETTE IMPERVIOUS 9X36 MD (GAUZE/BANDAGES/DRESSINGS) ×1 IMPLANT
SUT VIC AB 0 CT1 36 (SUTURE) ×2 IMPLANT
SUT VIC AB 2-0 CT1 TAPERPNT 27 (SUTURE) ×2 IMPLANT
SUT VIC AB 2-0 CT2 27 (SUTURE) ×2 IMPLANT
SUT VIC AB 3-0 SH 27X BRD (SUTURE) ×2 IMPLANT
SUTURE MNCRL 4-0 27XMF (SUTURE) IMPLANT
SUTURE TAPE 1.3 40 TPR END (SUTURE) IMPLANT
SYSTEM INTERNAL BRACE KNEE (Miscellaneous) IMPLANT
TRAP FLUID SMOKE EVACUATOR (MISCELLANEOUS) ×1 IMPLANT
WATER STERILE IRR 500ML POUR (IV SOLUTION) ×1 IMPLANT

## 2023-12-13 NOTE — Discharge Instructions (Signed)
 Post-Op Instructions - Quadriceps Tendon Repair  1. Bracing or crutches: You will be provided with a long brace (from hip to ankle) and crutches.  2. Ice: You will be provided with a device [facility]) that allows you to ice the affected area effectively.   3. Showering: Incision must remain dry for 5 days. Afterwards, you may shower and gently pat incision dry. NO submerging wound for 4 weeks.   4. Driving: You will be given specific driving precautions at discharge. Plan on not driving for at least one week for left knee surgery, and 4-6 weeks for right knee surgery if you are restricted due to the brace and knee motion. Please note that you are advised NOT to drive while taking narcotic pain medications as you may be impaired and unsafe to drive.  5. Activity: Weight bearing: Weight bearing as tolerated with brace locked in extension. Bending the knee is limited and will be guided by the physical therapist. Elevate knee above heart level as much as possible for one week. Avoid standing more than 5 minutes (consecutively) for the first week. No exercise involving the knee until cleared by the surgeon or physical therapist.  Avoid long distance travel for 4 weeks.  6. Medications: - You have been provided a prescription for narcotic pain medicine. After surgery, take 1-2 narcotic tablets every 4 hours if needed for severe pain.  - A prescription for anti-nausea medication will be provided in case the narcotic medicine causes nausea - take 1 tablet every 6 hours only if nauseated.  - Take aspirin  325mg  daily for 4 weeks to prevent blood clots.  -Take tylenol  1000 every 8 hours for pain.  May stop tylenol  3 days after surgery or when you are having minimal pain. -DO NOT TAKE IBUPROFEN, ALEVE or OTHER NSAIDs as they may interfere with healing.    If you are taking prescription medication for anxiety, depression, insomnia, muscle spasm, chronic pain, or for attention deficit disorder, you are  advised that you are at a higher risk of adverse effects with use of narcotics post-op, including narcotic addiction/dependence, depressed breathing, death. If you use non-prescribed substances: alcohol, marijuana, cocaine, heroin, methamphetamines, etc., you are at a higher risk of adverse effects with use of narcotics post-op, including narcotic addiction/dependence, depressed breathing, death. You are advised that taking > 50 morphine milligram equivalents (MME) of narcotic pain medication per day results in twice the risk of overdose or death. For your prescription provided: oxycodone  5 mg - taking more than 6 tablets per day would result in > 50 morphine milligram equivalents (MME) of narcotic pain medication. Be advised that we will prescribe narcotics short-term, for acute post-operative pain, only 3 weeks for major operations such as knee repair/reconstruction surgeries.   7. Bandages: The physical therapist should change the bandages at the first post-op appointment. If needed, the dressing supplies have been provided to you.  8. Physical Therapy: 1-2 times per week starting within ~1 week of surgery. You have been provided an order for physical therapy today and should have your first appointment scheduled. The therapist will provide home exercises.  9. Work or School: For most, but not all procedures, we advise staying out of work or school for at least 1 to 2 weeks in order to recover from the stress of surgery and to allow time for healing and swelling control. More labor intensive jobs may require additional time off. If you need a work or school note this can be provided.  10. Post-Op Appointments: Your first post-op appointment will be in approximately 2 weeks time. Please double check if this will be at the [facility] facility (Tuesdays and Thursdays) or [place] facility (Wednesdays).    If you find that they have not been scheduled please call the Orthopaedic Appointment front desk at  [phone].

## 2023-12-13 NOTE — Anesthesia Postprocedure Evaluation (Signed)
 Anesthesia Post Note  Patient: Daniel Tapia  Procedure(s) Performed: REPAIR, TENDON, QUADRICEPS (Left: Knee)  Patient location during evaluation: PACU Anesthesia Type: General Level of consciousness: awake and alert Pain management: pain level controlled Vital Signs Assessment: post-procedure vital signs reviewed and stable Respiratory status: spontaneous breathing, nonlabored ventilation, respiratory function stable and patient connected to nasal cannula oxygen Cardiovascular status: blood pressure returned to baseline and stable Postop Assessment: no apparent nausea or vomiting Anesthetic complications: no   No notable events documented.   Last Vitals:  Vitals:   12/13/23 0945 12/13/23 0953  BP: 115/74 114/76  Pulse: 74 75  Resp: 16 15  Temp:  (!) 36.1 C  SpO2: 95% 95%    Last Pain:  Vitals:   12/13/23 0953  PainSc: 0-No pain                 Debby Mines

## 2023-12-13 NOTE — H&P (Signed)
 Paper H&P to be scanned into permanent record. H&P reviewed. No significant changes noted.

## 2023-12-13 NOTE — Op Note (Addendum)
 DATE OF SURGERY: 12/13/2023  PRE-OP DIAGNOSIS: Left Quadricpes Tendon Rupture   POST-OP DIAGNOSIS: Left Quadriceps Tendon Rupture  PROCEDURES: Left Quadriceps Tendon Repair  SURGEON: Earnestine HILARIO Blanch, MD  ASSISTANT: DOROTHA Krystal Doyne, PA   ANESTHESIA: Gen  TOTAL IV FLUIDS: see anesthesia record  ESTIMATED BLOOD LOSS: 5cc  TOURNIQUET TIME: 59 min  DRAINS:  none  SPECIMENS: None.  IMPLANTS: Arthrex 4.64mm SwiveLock anchors x 2  COMPLICATIONS: None apparent.  INDICATIONS: Daniel Tapia is a 63 y.o. male who sustained a quadriceps tendon rupture after a fall. Physical exam was notable for an obvious gap in the quadriceps tendon just superior to the patella.  The patient was unable to perform a straight leg raise.  Imaging confirmed complete rupture of the quadriceps tendon. After discussion of risks, benefits, and alternatives to surgery, the patient elected to proceed with quadriceps tendon repair.  DETAILS OF PROCEDURE: Daniel Tapia was met in the preoperative holding area and informed consent was verified.  The patient was brought to the operating room and placed supine on the table. Anesthesia was administered. Leg was prescrubbed with alcohol, prepped with ChloraPrep, and draped in the usual sterile fashion. The patient was given preoperative IV antibiotics within 30 minutes of the start of the case, and a surgical time-out occurred. A well-padded tourniquet was placed.   The leg was elevated, exsanguinated with an Esmarch bandage and tourniquet inflated to . A midline incision was created on the knee from the superior pole of the patella to approximately 8 cm superiorly. The retinacular layer was developed, medial and lateral, in line with the skin incision. At that point, an obvious tear of the quadriceps tendon was notable. Medial and lateral retinacular tears were also identified. Edges of the quadriceps tendon were debrided to healthy tendon. The superior patella was  prepared by removing soft tissue proximally and any small bony fragments. I then created a small trough with a rongeur. Two SutureTape sutures were then placed in the proximal quadriceps tendon with medial Krackow stitch and a lateral Krackow stitch. A pin was in the superior patella at the medial 1/3 junction of the patella. Similarly, another pin was passed at the lateral 1/3 junction of the patella. Both pins were overdrilled, and then tapped twice in preparation of insertion of a 4.59mm SwiveLock anchor. The wound was thoroughly irrigated at this point. The medial SutureTape sutures were loaded into a 4.27mm SwiveLock anchor and the anchor was inserted with the appropriate amount of tension with the leg in full extension. This process was repeated for the lateral sutures. This construct appropriately reduced the quadriceps tendon to the superior pole of the patella. The FiberWire sutures from each anchor were used to oversew the quadriceps tendon to reinforce the repair. The medial and lateral retinacular layers were closed with 0 Vicryl suture in a figure of 8 fashion. The wound was irrigated again.  The patient could reach ~30 degrees of flexion before there was a significant increase in tension.  There was no gap formation between the superior patella and quadriceps tendon. Tourniquet was let down, and appropriate hemostasis was achieved with bovie electrocautery. Local anesthetic was injected. 2-0 Vicryl was used to close the subdermal layer tissue.  4-0 Monocryl and Dermabond were used to close skin.  Sterile dressing, PolarCare, and hinged knee brace locked at 0 degrees were applied. Instrument, sponge, and needle counts were correct prior to wound closure and at the conclusion of the case. The patient was then awakened  from anesthesia without complication.  Of note, assistance from a PA was essential to performing the surgery.  PA was present for the entire surgery.  PA assisted with patient  positioning, retraction, instrumentation, and wound closure. The surgery would have been more difficult and had longer operative time without PA assistance.    POST-OPERATIVE PLAN: - ASA 325mg /day x for DVT ppx - WBAT on operative lower extremity with brace locked in extension x 6 weeks - PT/OT to start within 1 week of surgery - Follow-up in approximately 2 weeks   REHAB PROTOCOL    GOALS:  ?A/AAROM 90-100 degrees by 6 weeks, 0-110 degrees by week 8, 0-130 degreesby week 10, and 0-135 degrees by week 12.  Week 1-4  No active ROM knee extension.  ?PROM knee ext to 0 degrees ?AROM/AAROM knee flexion - very gently - Safe range as determined in Operative note (amount of tension-free repair). 30 degrees ?Gradually unlock brace for sitting as PROM knee flexion improves  Exercises:  ?Ankle pumps ?Patellar mobilizations ?Hamstring stretch sitting ?Gastroc stretch with towel ?Heelslides ?Quad sets - may add E-stim for re-education at 2-3 weeks upon MD approval ?Patellar mobilization - all directions. ?SLR all directions, active assistive flexion- start at 3rd post-op week - do notallow lag - use e-stim as needed after 2-3 weeks. If unable to achieve fullextension, perform SLR in knee immobilizer  Week 5: Gradually increase A/AAROM knee flexion  Exercises:  ?Submaximal multi-angle isometrics (30-50% only) ?Continue knee flexion ROM - rocking chair at home ?Active SLR 4 way - no weight for flexion - watch for extensor lag - increaseresistance for hip abduction, adduction, and extension.  Add aquatic therapy if available. Move slowly so water  is assistive and not resistive  Aquatic therapy exercises:  ?With knee submerged in water , knee dangling at 80-90 degrees - slowly activelyextend knee to 0 degrees. ?Water  walking in chest deep water  ?SLR 4 way in the water  with knee straight ?Knee flexion in water   Week 6-8:  Brace - unlock for sitting to 90 degrees at 6 weeks. If quad control  sufficient at 8 weeks unlock brace 0-90 degrees for ambulation with bilateral axillary crutches and gradually open brace as ROM improves. Progress to ambulation at 8 weeks with no crutches as quadriceps strength allows. D/C crutches and brace at 8-12 weeks depending on patient's quadriceps control. Emphasize frequent ROM exercises  Goals - Gradually increase P/A/AAROM during weeks 6-8   Exercises:  ?Total gym semi squats level 3-4 ?Gradually increase weight on all SLR, if no lag present ?Week 6 - bike (begin with rocking and progress to full revolutions) ?Week 6 - Closed chain terminal knee extension with theraband ?Week 6 - SAQ (AROM) ?Week 7 - LAQ (AROM) ?Week 8 - SAQ (gradually increase resistance) ?Week 8 - LAQ (gradually increase resistance) ?Week 8 - weight shifts ?Week 8 - balance master and/or BAPS - with bilateral LE weight bearing ?Week 8 - cones  Week 9-10:  Exercises:  ?Total gym level 5-6 ?Bilateral leg press - concentric only - no significant load work until 12 weeks.  ?Weight shift on minitramp ?Toe rises ?Treadmill - Concentrate on pattern with eccentric knee control Week 11-16:  Exercises:  ?Leg press - Gradually increase weight and begin unilateral leg press at week 12 ?Wall squats ?Balance activities: unilateral stance eyes open and closed, balance master ?Standing minisquats ?Step-ups - start concentrically, 2" to start and progress as tolerated ?Week 16 - lunges ?Week 16 - stairclimber/elliptical machine  CRITERIA  TO START RUNNING PROGRAM  ?Patient is able to walk with a normal gait pattern for at least 20 minutes withoutsymptoms and performs ADL's painfree ?ROM is equal to uninvolved side, or at least 0-125 degrees ?Hamstring and quadriceps strength is 70% of the uninvolved side isokinetically ?Patient without pain, edema, crepitus, or giving-way   ?Wall squats ?Balance activities: unilateral stance eyes open and closed, balance master ?Standing  minisquats ?Step-ups - start concentrically, 2" to start and progress as tolerated ?Week 16 - lunges ?Week 16 - stairclimber/elliptical machine  CRITERIA TO START RUNNING PROGRAM  ?Patient is able to walk with a normal gait pattern for at least 20 minutes withoutsymptoms and performs ADL's painfree ?ROM is equal to uninvolved side, or at least 0-125 degrees ?Hamstring and quadriceps strength is 70% of the uninvolved side isokinetically ?Patient without pain, edema, crepitus, or giving-way

## 2023-12-13 NOTE — Anesthesia Preprocedure Evaluation (Signed)
 Anesthesia Evaluation  Patient identified by MRN, date of birth, ID band Patient awake    Reviewed: Allergy & Precautions, H&P , NPO status , Patient's Chart, lab work & pertinent test results, reviewed documented beta blocker date and time   Airway Mallampati: II  TM Distance: >3 FB Neck ROM: full    Dental no notable dental hx. (+) Chipped   Pulmonary neg pulmonary ROS   Pulmonary exam normal breath sounds clear to auscultation       Cardiovascular Exercise Tolerance: Good negative cardio ROS Normal cardiovascular exam Rhythm:regular Rate:Normal     Neuro/Psych negative neurological ROS  negative psych ROS   GI/Hepatic negative GI ROS, Neg liver ROS,,,  Endo/Other  negative endocrine ROS    Renal/GU Renal disease     Musculoskeletal   Abdominal   Peds  Hematology negative hematology ROS (+)   Anesthesia Other Findings Past Medical History: No date: Elevated LDL cholesterol level  Past Surgical History: 07/06/2017: COLONOSCOPY WITH PROPOFOL ; N/A     Comment:  Procedure: COLONOSCOPY WITH PROPOFOL ;  Surgeon: Unk Corinn Skiff, MD;  Location: [facility];                Service: Endoscopy;  Laterality: N/A; No date: HEMORRHOID SURGERY     Comment:  1990's 2007: PLANTAR FASCIA SURGERY 07/06/2017: POLYPECTOMY     Comment:  Procedure: POLYPECTOMY;  Surgeon: Unk Corinn Skiff,               MD;  Location: [facility];  Service: Endoscopy;;  BMI    Body Mass Index: 28.07 kg/m      Reproductive/Obstetrics negative OB ROS                              Anesthesia Physical Anesthesia Plan  ASA: 2  Anesthesia Plan: General ETT   Post-op Pain Management:    Induction: Intravenous  PONV Risk Score and Plan: 2 and Ondansetron , Dexamethasone  and Midazolam   Airway Management Planned: Oral ETT  Additional Equipment:   Intra-op Plan:    Post-operative Plan: Extubation in OR  Informed Consent: I have reviewed the patients History and Physical, chart, labs and discussed the procedure including the risks, benefits and alternatives for the proposed anesthesia with the patient or authorized representative who has indicated his/her understanding and acceptance.     Dental Advisory Given  Plan Discussed with: Anesthesiologist, CRNA and Surgeon  Anesthesia Plan Comments: (Patient consented for risks of anesthesia including but not limited to:  - adverse reactions to medications - damage to eyes, teeth, lips or other oral mucosa - nerve damage due to positioning  - sore throat or hoarseness - Damage to heart, brain, nerves, lungs, other parts of body or loss of life  Patient voiced understanding and assent.)        Anesthesia Quick Evaluation

## 2023-12-13 NOTE — Transfer of Care (Signed)
 Immediate Anesthesia Transfer of Care Note  Patient: Daniel Tapia  Procedure(s) Performed: REPAIR, TENDON, QUADRICEPS (Left: Knee)  Patient Location: PACU  Anesthesia Type:General  Level of Consciousness: sedated  Airway & Oxygen Therapy: Patient Spontanous Breathing and Patient connected to face mask oxygen  Post-op Assessment: Report given to RN and Post -op Vital signs reviewed and stable  Post vital signs: Reviewed and stable  Last Vitals: Normal temp see pacu flow sheet.   Vitals Value Taken Time  BP 136/98 12/13/23 09:15  Temp    Pulse 77 12/13/23 09:17  Resp 12 12/13/23 09:17  SpO2 100 % 12/13/23 09:17  Vitals shown include unfiled device data.  Last Pain: zero Vitals:   12/13/23 0609  PainSc: 3          Complications: No notable events documented.

## 2023-12-13 NOTE — Anesthesia Procedure Notes (Signed)
 Procedure Name: LMA Insertion Date/Time: 12/13/2023 7:43 AM  Performed by: Jarvis Lew, CRNAPre-anesthesia Checklist: Patient identified, Patient being monitored, Timeout performed, Emergency Drugs available and Suction available Patient Re-evaluated:Patient Re-evaluated prior to induction Oxygen Delivery Method: Circle system utilized Preoxygenation: Pre-oxygenation with 100% oxygen Induction Type: IV induction Ventilation: Mask ventilation without difficulty LMA: LMA inserted LMA Size: 5.0 Tube type: Oral Number of attempts: 1 Placement Confirmation: positive ETCO2 and breath sounds checked- equal and bilateral Tube secured with: Tape Dental Injury: Teeth and Oropharynx as per pre-operative assessment  Comments: Eyes taped closed prior to gentle atraumatic insertion of Igel 5 (lubricated with Surgilube) using tongue blade for placement.   Pos. = BBS with good seal x 1 attempt.

## 2023-12-14 ENCOUNTER — Encounter: Payer: Self-pay | Admitting: Orthopedic Surgery

## 2024-05-24 ENCOUNTER — Encounter: Payer: Self-pay | Admitting: Family Medicine
